# Patient Record
Sex: Male | Born: 1980
Health system: Southern US, Community
[De-identification: ages and names within clinical notes are randomized; demographics above are authoritative.]

## PROBLEM LIST (undated history)

## (undated) DIAGNOSIS — K279 Peptic ulcer, site unspecified, unspecified as acute or chronic, without hemorrhage or perforation: Secondary | ICD-10-CM

## (undated) DIAGNOSIS — B9681 Helicobacter pylori [H. pylori] as the cause of diseases classified elsewhere: Secondary | ICD-10-CM

## (undated) HISTORY — DX: Helicobacter pylori (H. pylori) as the cause of diseases classified elsewhere: B96.81

## (undated) HISTORY — DX: Peptic ulcer, site unspecified, unspecified as acute or chronic, without hemorrhage or perforation: K27.9

---

## 2009-05-17 ENCOUNTER — Emergency Department (HOSPITAL_COMMUNITY): Admission: EM | Admit: 2009-05-17 | Discharge: 2009-05-17 | Payer: Self-pay | Admitting: Family Medicine

## 2009-06-25 ENCOUNTER — Emergency Department (HOSPITAL_COMMUNITY): Admission: EM | Admit: 2009-06-25 | Discharge: 2009-06-25 | Payer: Self-pay | Admitting: Emergency Medicine

## 2012-01-06 ENCOUNTER — Encounter (HOSPITAL_COMMUNITY): Payer: Self-pay | Admitting: *Deleted

## 2012-01-06 ENCOUNTER — Emergency Department (HOSPITAL_COMMUNITY)
Admission: EM | Admit: 2012-01-06 | Discharge: 2012-01-07 | Disposition: A | Discharge status: Home / Self Care | Payer: No Typology Code available for payment source | Attending: Emergency Medicine | Admitting: Emergency Medicine

## 2012-01-06 DIAGNOSIS — M539 Dorsopathy, unspecified: Secondary | ICD-10-CM | POA: Insufficient documentation

## 2012-01-06 DIAGNOSIS — R040 Epistaxis: Secondary | ICD-10-CM | POA: Insufficient documentation

## 2012-01-06 DIAGNOSIS — S139XXA Sprain of joints and ligaments of unspecified parts of neck, initial encounter: Secondary | ICD-10-CM | POA: Insufficient documentation

## 2012-01-06 DIAGNOSIS — Y92410 Unspecified street and highway as the place of occurrence of the external cause: Secondary | ICD-10-CM | POA: Insufficient documentation

## 2012-01-06 DIAGNOSIS — S161XXA Strain of muscle, fascia and tendon at neck level, initial encounter: Secondary | ICD-10-CM

## 2012-01-06 DIAGNOSIS — M542 Cervicalgia: Secondary | ICD-10-CM | POA: Insufficient documentation
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
## 2012-01-07 ENCOUNTER — Emergency Department (HOSPITAL_COMMUNITY): Payer: No Typology Code available for payment source

## 2012-01-07 MED ORDER — CYCLOBENZAPRINE HCL 10 MG PO TABS: 10.0000 mg | ORAL_TABLET | Freq: Two times a day (BID) | ORAL | Status: AC | PRN

## 2012-01-07 MED ORDER — HYDROCODONE-ACETAMINOPHEN 5-325 MG PO TABS: 1.0000 | ORAL_TABLET | ORAL | Status: AC | PRN

## 2012-01-07 NOTE — ED Notes (Signed)
Pt sitting in chair, remains in c-collar.

## 2012-01-07 NOTE — ED Notes (Signed)
No changes, "ready to go".
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
## 2012-01-07 NOTE — Discharge Instructions (Signed)
Cervical Sprain and Strain A cervical sprain is an injury to the neck. The injury can include either over-stretching or even small tears in the ligaments that hold the bones of the neck in place. A strain affects muscles and tendons. Minor injuries usually only involve ligaments and muscles. Because the different parts of the neck are so close together, more severe injuries can involve both sprain and strain. These injuries can affect the muscles, ligaments, tendons, discs, and nerves in the neck. CAUSES  An injury may be the result of a direct blow or from certain habits that can lead to the symptoms noted above.  Injury from:   Contact sports (such as football, rugby, wrestling, hockey, auto racing, gymnastics, diving, martial arts, and boxing).   Motor vehicle accidents.   Whiplash injuries (see image at right). These are common. They occur when the neck is forcefully whipped or forced backward and/or forward.   Falls.   Lifestyle or awkward postures:   Cradling a telephone between the ear and shoulder.   Sitting in a chair that offers no support.   Working at an ill-designed computer station.   Activities that require hours of repeated or long periods of looking up (stretching the neck backward) or looking down (bending the head/neck forward).  SYMPTOMS   Pain, soreness, stiffness, or burning sensation in the front, back, or sides of the neck. This may develop immediately after injury. Onset of discomfort may also develop slowly and not begin for 24 hours or more.   Shoulder and/or upper back pain.   Limits to the normal movement of the neck.   Headache.   Dizziness.   Weakness and/or abnormal sensation (such as numbness or tingling) of one or both arms and/or hands.   Muscle spasm.   Difficulty with swallowing or chewing.   Tenderness and swelling at the injury site.  DIAGNOSIS  Most of the time, your caregiver can diagnose this problem with a careful history and  examination. The history will include information about known problems (such as arthritis in the neck) or a previous neck injury. X-rays may be ordered to find out if there is a different problem. X-rays can also help to find problems with the bones of the neck not related to the injury or current symptoms. TREATMENT  Several treatment options are available to help pain, spasm, and other symptoms. They include:  Cold helps relieve pain and reduce inflammation. Cold should be applied for 10 to 15 minutes every 2 to 3 hours after any activity that aggravates your symptoms. Use ice packs or an ice massage. Place a towel or cloth in between your skin and the ice pack.   Medication:   Only take over-the-counter or prescription medicines for pain, discomfort, or fever as directed by your caregiver.   Pain relievers or muscle relaxants may be prescribed. Use only as directed and only as much as you need.   Change in the activity that caused the problem. This might include using a headset with a telephone so that the phone is not propped between your ear and shoulder.   Neck collar. Your caregiver may recommend temporary use of a soft cervical collar.   Work station. Changes may be needed in your work place. A better sitting position and/or better posture during work may be part of your treatment.   Physical Therapy. Your caregiver may recommend physical therapy. This can include instructions in the use of stretching and strengthening exercises. Improvement in posture is important.   Exercises and posture training can help stabilize the neck and strengthen muscles and keep symptoms from returning.  HOME CARE INSTRUCTIONS  Other than formal physical therapy, all treatments above can be done at home. Even when not at work, it is important to be conscious of your posture and of activities that can cause a return of symptoms. Most cervical sprains and/or strains are better in 1-3 weeks. As you improve and  increase activities, doing a warm up and stretching before the activity will help prevent recurrent problems. SEEK MEDICAL CARE IF:   Pain is not effectively controlled with medication.   You feel unable to decrease pain medication over time as planned.   Activity level is not improving as planned and/or expected.  SEEK IMMEDIATE MEDICAL CARE IF:   While using medication, you develop any bleeding, stomach upset, or signs of an allergic reaction.   Symptoms get worse, become intolerable, and are not helped by medications.   New, unexplained symptoms develop.   You experience numbness, tingling, weakness, or paralysis of any part of your body.  MAKE SURE YOU:   Understand these instructions.   Will watch your condition.   Will get help right away if you are not doing well or get worse.  Document Released: 09/04/2007 Document Revised: 07/20/2011 Document Reviewed: 09/04/2007 [facility] Patient Information 2012 [place], [place].Nosebleed A nosebleed can be caused by many things, including:  Getting hit hard in the nose.   Infections.   Dry nose.   Colds.   Medicines.  Your doctor may do lab testing if you get nosebleeds a lot and the cause is not known. HOME CARE   If your nose was packed with material, keep it there until your doctor takes it out. Put the pack back in your nose if the pack falls out.   Do not blow your nose for 12 hours after the nosebleed.   Sit up and bend forward if your nose starts bleeding again. Pinch the front half of your nose nonstop for 20 minutes.   Put petroleum jelly inside your nose every morning if you have a dry nose.   Use a humidifier to make the air less dry.   Do not take aspirin.   Try not to strain, lift, or bend at the waist for many days after the nosebleed.  GET HELP RIGHT AWAY IF:   Nosebleeds keep happening and are hard to stop or control.   You have bleeding or bruises that are not normal on other parts of the body.    You have a fever.   The nosebleeds get worse.   You get lightheaded, feel faint, sweaty, or throw up (vomit) blood.  MAKE SURE YOU:   Understand these instructions.   Will watch your condition.   Will get help right away if you are not doing well or get worse.  Document Released: 08/16/2008 Document Revised: 07/20/2011 Document Reviewed: 08/16/2008 [facility] Patient Information 2012 [place], [place].

## 2013-02-15 ENCOUNTER — Ambulatory Visit (INDEPENDENT_AMBULATORY_CARE_PROVIDER_SITE_OTHER): Payer: BC Managed Care – PPO | Admitting: Family Medicine

## 2013-02-15 VITALS — BP 110/60 | HR 53 | Temp 98.5°F | Resp 16 | Ht 72.0 in | Wt 190.2 lb

## 2013-02-15 DIAGNOSIS — H6120 Impacted cerumen, unspecified ear: Secondary | ICD-10-CM

## 2013-02-15 DIAGNOSIS — H9202 Otalgia, left ear: Secondary | ICD-10-CM

## 2013-02-15 DIAGNOSIS — H6122 Impacted cerumen, left ear: Secondary | ICD-10-CM

## 2013-02-15 DIAGNOSIS — H9209 Otalgia, unspecified ear: Secondary | ICD-10-CM

## 2013-02-15 NOTE — Patient Instructions (Addendum)
Let us know if you have any other concerns

## 2013-02-15 NOTE — Progress Notes (Signed)
[facility] [address], [place] [address] [phone]  Date:  02/15/2013   Name:  Peter Townsend   DOB:  [birth year]   MRN:  [id number]  PCP:  No primary provider on file.    Chief Complaint: Otalgia   History of Present Illness:  Peter Townsend is a 32 y.o. very pleasant male patient who presents with the following:  He is here with left ear pain for a couple of weeks- it feels clogged.  The right ear is not painful, but also feels clogged.  He does not have a ST or cough, otherwise he feels well.    There is no problem list on file for this patient.   History reviewed. No pertinent past medical history.  History reviewed. No pertinent past surgical history.  History  Substance Use Topics  . Smoking status: Never Smoker   . Smokeless tobacco: Not on file  . Alcohol Use: No    No family history on file.  No Known Allergies  Medication list has been reviewed and updated.  No current outpatient prescriptions on file prior to visit.   No current facility-administered medications on file prior to visit.    Review of Systems:  As per HPI- otherwise negative.   Physical Examination: Filed Vitals:   02/15/13 1543  BP: 110/60  Pulse: 53  Temp: 98.5 F (36.9 C)  Resp: 16   Filed Vitals:   02/15/13 1543  Height: 6' (1.829 m)  Weight: 190 lb 3.2 oz (86.274 kg)   Body mass index is 25.79 kg/(m^2). Ideal Body Weight: Weight in (lb) to have BMI = 25: 183.9  GEN: WDWN, NAD, Non-toxic, A & O x 3 HEENT: Atraumatic, Normocephalic. Neck supple. No masses, No LAD.  Left TM is obscured by cerumen.  Oropharynx wnl.  PEERL Ears and Nose: No external deformity. CV: RRR, No M/G/R. No JVD. No thrill. No extra heart sounds. PULM: CTA B, no wheezes, crackles, rhonchi. No retractions. No resp. distress. No accessory muscle use. EXTR: No c/c/e NEURO Normal gait.  PSYCH: Normally interactive. Conversant. Not depressed or anxious appearing.  Calm  demeanor.   Cerumen impaction resolved with irrigation- TM and ear canal then normal.   Assessment and Plan: Cerumen impaction, left  Left ear pain   Resolved as above with irrigation.  Follow- up if needed Signed Abbe Amsterdam, MD

## 2013-05-02 ENCOUNTER — Ambulatory Visit (INDEPENDENT_AMBULATORY_CARE_PROVIDER_SITE_OTHER): Payer: BC Managed Care – PPO | Admitting: Physician Assistant

## 2013-05-02 VITALS — BP 107/69 | HR 47 | Temp 98.0°F | Resp 16 | Ht 72.5 in | Wt 187.0 lb

## 2013-05-02 DIAGNOSIS — Z23 Encounter for immunization: Secondary | ICD-10-CM

## 2013-05-02 NOTE — Progress Notes (Signed)
  Subjective:    Patient ID: Peter Townsend, male    DOB: 1981-09-26, 32 y.o.   MRN: [id number]  HPI  32 year old male presents for 3rd Tdap. Last 2 were in 2010. 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  He is otherwise healthy with no other concerns today.      Review of Systems  Constitutional: Negative for fever and chills.  Respiratory: Negative for cough.   Skin: Negative for rash.       Objective:   Physical Exam  Constitutional: He is oriented to person, place, and time. He appears well-developed and well-nourished.  HENT:  Head: Normocephalic and atraumatic.  Right Ear: External ear normal.  Left Ear: External ear normal.  Eyes: Conjunctivae are normal.  Neck: Normal range of motion.  Cardiovascular: Normal rate, regular rhythm and normal heart sounds.   Pulmonary/Chest: Effort normal and breath sounds normal.  Neurological: He is alert and oriented to person, place, and time.  Psychiatric: He has a normal mood and affect. His behavior is normal. Judgment and thought content normal.          Assessment & Plan:  Need for Tdap vaccination - Plan: Tdap vaccine greater than or equal to 7yo IM  Tdap #3 given. Form completed and signed.  Follow up as needed.

## 2016-04-09 ENCOUNTER — Ambulatory Visit (INDEPENDENT_AMBULATORY_CARE_PROVIDER_SITE_OTHER): Payer: BLUE CROSS/BLUE SHIELD

## 2016-04-09 ENCOUNTER — Ambulatory Visit (INDEPENDENT_AMBULATORY_CARE_PROVIDER_SITE_OTHER): Payer: BLUE CROSS/BLUE SHIELD | Admitting: Physician Assistant

## 2016-04-09 VITALS — BP 110/72 | HR 58 | Temp 98.1°F | Resp 16 | Ht 72.5 in | Wt 180.2 lb

## 2016-04-09 DIAGNOSIS — R1032 Left lower quadrant pain: Secondary | ICD-10-CM | POA: Diagnosis not present

## 2016-04-09 LAB — POCT CBC
GRANULOCYTE PERCENT: 52.4 % (ref 37–80)
HCT, POC: 42.1 % — AB (ref 43.5–53.7)
HEMOGLOBIN: 14.9 g/dL (ref 14.1–18.1)
LYMPH, POC: 1.8 (ref 0.6–3.4)
MCH, POC: 29.8 pg (ref 27–31.2)
MCHC: 35.3 g/dL (ref 31.8–35.4)
MCV: 84.3 fL (ref 80–97)
MID (cbc): 0.2 (ref 0–0.9)
MPV: 7 fL (ref 0–99.8)
POC GRANULOCYTE: 2.3 (ref 2–6.9)
POC LYMPH PERCENT: 43 %L (ref 10–50)
POC MID %: 4.6 %M (ref 0–12)
Platelet Count, POC: 239 10*3/uL (ref 142–424)
RBC: 5 M/uL (ref 4.69–6.13)
RDW, POC: 12.4 %
WBC: 4.3 10*3/uL — AB (ref 4.6–10.2)

## 2016-04-09 NOTE — Progress Notes (Signed)
04/09/2016 4:23 PM   DOB: 04/09/1981 / MRN: [id number]  SUBJECTIVE:  Peter Townsend is a 35 y.o. male presenting for abdominal pain that has been present for the last 1 months.  Describes the pain as sharp, LLQ, worse in the morning, and better with defecation.  Reports it take him 30 minutes to complete a bowel movement.  He defecates daily.   He has No Known Allergies.   He  has no past medical history on file.    He  reports that he has never smoked. He does not have any smokeless tobacco history on file. He reports that he does not drink alcohol or use illicit drugs. He  has no sexual activity history on file. The patient  has no past surgical history on file.  His family history is not on file.  Review of Systems  Constitutional: Negative for fever and chills.  Cardiovascular: Negative for chest pain.  Gastrointestinal: Positive for constipation. Negative for nausea, blood in stool and melena.  Genitourinary: Negative for dysuria.  Musculoskeletal: Negative for myalgias.  Skin: Negative for rash.  Neurological: Negative for dizziness and headaches.  Psychiatric/Behavioral: Negative for depression.    Problem list and medications reviewed and updated by myself where necessary, and exist elsewhere in the encounter.   OBJECTIVE:  BP 110/72 mmHg  Pulse 58  Temp(Src) 98.1 F (36.7 C) (Oral)  Resp 16  Ht 6' 0.5" (1.842 m)  Wt 180 lb 3.2 oz (81.738 kg)  BMI 24.09 kg/m2  SpO2 99%  Physical Exam  Constitutional: He is oriented to person, place, and time. He appears well-developed. He does not appear ill.  Eyes: Conjunctivae and EOM are normal. Pupils are equal, round, and reactive to light.  Cardiovascular: Normal rate.   Pulmonary/Chest: Effort normal.  Abdominal: He exhibits no distension.  Musculoskeletal: Normal range of motion.  Neurological: He is alert and oriented to person, place, and time. No cranial nerve deficit. Coordination normal.  Skin: Skin is warm and dry.  He is not diaphoretic.  Psychiatric: He has a normal mood and affect.  Nursing note and vitals reviewed.   Results for orders placed or performed in visit on 04/09/16 (from the past 72 hour(s))  POCT CBC     Status: Abnormal   Collection Time: 04/09/16  4:08 PM  Result Value Ref Range   WBC 4.3 (A) 4.6 - 10.2 K/uL   Lymph, poc 1.8 0.6 - 3.4   POC LYMPH PERCENT 43.0 10 - 50 %L   MID (cbc) 0.2 0 - 0.9   POC MID % 4.6 0 - 12 %M   POC Granulocyte 2.3 2 - 6.9   Granulocyte percent 52.4 37 - 80 %G   RBC 5.00 4.69 - 6.13 M/uL   Hemoglobin 14.9 14.1 - 18.1 g/dL   HCT, POC 42.1 (A) 43.5 - 53.7 %   MCV 84.3 80 - 97 fL   MCH, POC 29.8 27 - 31.2 pg   MCHC 35.3 31.8 - 35.4 g/dL   RDW, POC 12.4 %   Platelet Count, POC 239 142 - 424 K/uL   MPV 7.0 0 - 99.8 fL    Dg Abd 2 Views  04/09/2016  CLINICAL DATA:  Left lower quadrant belly pain. EXAM: ABDOMEN - 2 VIEW COMPARISON:  None. FINDINGS: The bowel gas pattern is normal. There is no evidence of free air. No radio-opaque calculi or other significant radiographic abnormality is seen. IMPRESSION: Negative. Electronically Signed   By: Dorise Bullion III  M.D   On: 04/09/2016 16:10    ASSESSMENT AND PLAN  Zhane was seen today for urinary retention.  Diagnoses and all orders for this visit:  LLQ abdominal pain: Most likely constipation.  Rads and labs negative thus far.  Will advise prep via AVS. Advised he get more fiber.  -     POCT CBC -     COMPLETE METABOLIC PANEL WITH GFR -     Lipase -     DG Abd 2 Views; Future   The patient was advised to call or return to clinic if he does not see an improvement in symptoms or to seek the care of the closest emergency department if he worsens with the above plan.   Philis Fendt, MHS, PA-C [facility] 04/09/2016 4:23 PM

## 2016-04-09 NOTE — Patient Instructions (Addendum)
For constipation   Make sure you are drinking enough water daily. Make sure you are getting enough fiber in your diet - this will make you regular - you can eat high fiber foods or use metamucil as a supplement - it is really important to drink enough water when using fiber supplements.  If your stools are hard or are formed balls or you have to strain a stool softener will help - use colace 2-3 capsule a day  For gentle treatment of constipation Use Miralax 1-2 capfuls a day until your stools are soft and regular and then decrease the usage - you can use this daily  For more aggressive treatment of constipation Use 4 capfuls of Colace and 6 doses of Miralax and drink it in 2 hours - this should result in several watery stools - if it does not repeat the next day and then go to daily miralax for a week to make sure your bowels are clean and retrained to work properly  For the most aggressive treatment of constipation Use 14 capfuls of Miralax in 1 gallon of fluid (gatoraid or water work well or a combination of the two) and drink over 12h - it is ok to eat during this time and then use Miralax 1 capful daily for about 2 weeks to prevent the constipation from returning    IF you received an x-ray today, you will receive an invoice from [facility]. Please contact [facility] at [phone] with questions or concerns regarding your invoice.   IF you received labwork today, you will receive an invoice from [employer]. Please contact [facility] at [phone] with questions or concerns regarding your invoice.   Our billing staff will not be able to assist you with questions regarding bills from these companies.  You will be contacted with the lab results as soon as they are available. The fastest way to get your results is to activate your My Chart account. Instructions are located on the last page of this paperwork. If you have not heard from [place]  regarding the results in 2 weeks, please contact this office.

## 2016-04-11 LAB — COMPLETE METABOLIC PANEL WITH GFR
ALT: 18 U/L (ref 9–46)
AST: 16 U/L (ref 10–40)
Albumin: 4.8 g/dL (ref 3.6–5.1)
Alkaline Phosphatase: 60 U/L (ref 40–115)
BILIRUBIN TOTAL: 0.9 mg/dL (ref 0.2–1.2)
BUN: 14 mg/dL (ref 7–25)
CO2: 29 mmol/L (ref 20–31)
CREATININE: 0.87 mg/dL (ref 0.60–1.35)
Calcium: 9.5 mg/dL (ref 8.6–10.3)
Chloride: 102 mmol/L (ref 98–110)
GFR, Est African American: >89 mL/min (ref >=60)
GFR, Est Non African American: >89 mL/min (ref >=60)
GLUCOSE: 97 mg/dL (ref 65–99)
Potassium: 5.2 mmol/L (ref 3.5–5.3)
SODIUM: 139 mmol/L (ref 135–146)
TOTAL PROTEIN: 7.1 g/dL (ref 6.1–8.1)

## 2016-04-11 LAB — LIPASE: Lipase: 25 U/L (ref 7–60)

## 2016-08-16 ENCOUNTER — Telehealth: Payer: Self-pay

## 2016-08-16 ENCOUNTER — Other Ambulatory Visit: Payer: Self-pay | Admitting: Physician Assistant

## 2016-08-16 DIAGNOSIS — R109 Unspecified abdominal pain: Principal | ICD-10-CM

## 2016-08-16 DIAGNOSIS — G8929 Other chronic pain: Secondary | ICD-10-CM

## 2016-08-16 NOTE — Telephone Encounter (Signed)
I'll have to send him to a GI doc.  He is too young for a screening and his insurance may push back.  I will get him in with [facility].  Philis Fendt, MS, PA-C 1:40 PM, 08/16/2016

## 2016-08-16 NOTE — Telephone Encounter (Signed)
Patient has been seen for colon issues and would like a referral for a coloscopy. Please advise!  [phone]

## 2016-08-23 ENCOUNTER — Encounter: Payer: Self-pay | Admitting: *Deleted

## 2016-08-24 ENCOUNTER — Encounter: Payer: Self-pay | Admitting: Internal Medicine

## 2016-08-24 ENCOUNTER — Ambulatory Visit (INDEPENDENT_AMBULATORY_CARE_PROVIDER_SITE_OTHER): Payer: BLUE CROSS/BLUE SHIELD | Admitting: Internal Medicine

## 2016-08-24 VITALS — Ht 71.26 in | Wt 182.1 lb

## 2016-08-24 DIAGNOSIS — R109 Unspecified abdominal pain: Secondary | ICD-10-CM | POA: Diagnosis not present

## 2016-08-24 DIAGNOSIS — Z800 Family history of malignant neoplasm of digestive organs: Secondary | ICD-10-CM

## 2016-08-24 DIAGNOSIS — R194 Change in bowel habit: Secondary | ICD-10-CM

## 2016-08-24 MED ORDER — NA SULFATE-K SULFATE-MG SULF 17.5-3.13-1.6 GM/177ML PO SOLN: ORAL | 0 refills | Status: DC

## 2016-08-24 NOTE — Progress Notes (Signed)
HPI: Peter Townsend is a 35 year old male with little past medical history who seen in consultation at the request of Philis Fendt, PA-C to evaluate left-sided abdominal pain. He reports that over the last 5-6 months he's developed a left-sided abdominal pain associated with the change in bowel habit. He's having bowel movements approximately 2 times per day but his bowel movements have felt incomplete and seen to be taking longer to occur. The pain is better after bowel movement. In the morning he often wakes with left-sided abdominal pain which can be sharp. It again gets better after bowel movement. Initially he felt like the pain may be musculoskeletal due to working out but the pain has persisted. He reports having a history of hemorrhoids that are not bothering him now. He denies blood in his stool or melena. He reports no upper GI complaint including heartburn, dysphagia or odynophagia. No weight loss.  Of note his father was diagnosed with metastatic colon cancer at age 34 in [place]. His brothers have also recently had colonoscopies with colon polyps.  He does not take any medicines and denies prior surgeries.  No past medical history on file.  No past surgical history on file.  No outpatient prescriptions prior to visit.   No facility-administered medications prior to visit.     No Known Allergies  Family History  Problem Relation Age of Onset  . Colon cancer Father 46  . Colon polyps Brother   . Colon polyps Brother     Social History  Substance Use Topics  . Smoking status: Never Smoker  . Smokeless tobacco: Never Used  . Alcohol use No    ROS: As per history of present illness, otherwise negative  BP (P) 114/66   Pulse (P) 64   Ht 5' 11.26" (1.81 m) Comment: w/o shoes  Wt 182 lb 2 oz (82.6 kg)   BMI 25.22 kg/m  Constitutional: Well-developed and well-nourished. No distress. HEENT: Normocephalic and atraumatic. Oropharynx is clear and moist. No oropharyngeal  exudate. Conjunctivae are normal.  No scleral icterus. Neck: Neck supple. Trachea midline. Cardiovascular: Normal rate, regular rhythm and intact distal pulses. No M/R/G Pulmonary/chest: Effort normal and breath sounds normal. No wheezing, rales or rhonchi. Abdominal: Soft, Periumbilical and lower abdominal tenderness without rebound or guarding, nondistended. Bowel sounds active throughout. There are no masses palpable. No hepatosplenomegaly. Extremities: no clubbing, cyanosis, or edema Lymphadenopathy: No cervical adenopathy noted. Neurological: Alert and oriented to person place and time. Skin: Skin is warm and dry. No rashes noted. Psychiatric: Normal mood and affect. Behavior is normal.  RELEVANT LABS AND IMAGING: CBC    Component Value Date/Time   WBC 4.3 (A) 04/09/2016 1608   RBC 5.00 04/09/2016 1608   HGB 14.9 04/09/2016 1608   HCT 42.1 (A) 04/09/2016 1608   MCV 84.3 04/09/2016 1608   MCH 29.8 04/09/2016 1608   MCHC 35.3 04/09/2016 1608    CMP     Component Value Date/Time   NA 139 04/09/2016 1554   K 5.2 04/09/2016 1554   CL 102 04/09/2016 1554   CO2 29 04/09/2016 1554   GLUCOSE 97 04/09/2016 1554   BUN 14 04/09/2016 1554   CREATININE 0.87 04/09/2016 1554   CALCIUM 9.5 04/09/2016 1554   PROT 7.1 04/09/2016 1554   ALBUMIN 4.8 04/09/2016 1554   AST 16 04/09/2016 1554   ALT 18 04/09/2016 1554   ALKPHOS 60 04/09/2016 1554   BILITOT 0.9 04/09/2016 1554   GFRNONAA >89 04/09/2016 1554   GFRAA >89  04/09/2016 1554    ASSESSMENT/PLAN: 35 year old male with little past medical history who seen in consultation at the request of Philis Fendt, PA-C to evaluate left-sided abdominal pain.  1. Left-sided abdominal pain/change in bowel habits/family history of colon cancer and colon polyps -- I recommended that we proceed to colonoscopy to evaluate his symptoms particularly in light of his family history. We discussed the risks, benefits and alternatives and he wishes to  proceed. If colonoscopy is normal we can discuss the addition of fiber or even a mild laxative to help aid in bowel movements, as the colon seems to be the source of his discomfort giving improvement after bowel movement.      [id number] Renaldo Harrison, Pa-c [address] [place], [place] [address]

## 2016-08-24 NOTE — Patient Instructions (Signed)
You have been scheduled for a colonoscopy. Please follow written instructions given to you at your visit today.  Please pick up your prep supplies at the pharmacy within the next 1-3 days. If you use inhalers (even only as needed), please bring them with you on the day of your procedure. Your physician has requested that you go to www.startemmi.com and enter the access code given to you at your visit today. This web site gives a general overview about your procedure. However, you should still follow specific instructions given to you by our office regarding your preparation for the procedure.  If you are age 72 or older, your body mass index should be between 23-30. Your Body mass index is 25.22 kg/m. If this is out of the aforementioned range listed, please consider follow up with your Primary Care Provider.  If you are age 81 or younger, your body mass index should be between 19-25. Your Body mass index is 25.22 kg/m. If this is out of the aformentioned range listed, please consider follow up with your Primary Care Provider.

## 2016-08-29 ENCOUNTER — Telehealth: Payer: Self-pay | Admitting: *Deleted

## 2016-08-29 ENCOUNTER — Telehealth: Payer: Self-pay

## 2016-08-29 ENCOUNTER — Telehealth: Payer: Self-pay | Admitting: Internal Medicine

## 2016-08-29 NOTE — Telephone Encounter (Signed)
Procedure is on thursday

## 2016-08-29 NOTE — Telephone Encounter (Signed)
Called and spoke to the pharmacist today, 10-9.  Gave her the RX BIN, PCN Group and ID numbers on the Suprep savings card.  The patient is to pay no more than $50.00.  The pharmacist was having trouble with her system.  She said she knows it will go though. Historically when we call with these numbers on the ( No more than $50) Suprep card, it goes through for $50.00.  She thanked me for calling them with these numbers.

## 2016-08-29 NOTE — Telephone Encounter (Signed)
Left message for patient that I had a coupon that would make the Suprep $50.  If that will work for him I will call the pharmacy and give them the necessary information

## 2016-08-30 NOTE — Telephone Encounter (Signed)
Pam gave the pharmacy the $50 coupon information for Suprep and will call the patient to let him know.

## 2016-08-30 NOTE — Telephone Encounter (Signed)
$  50 coupon used - Pam will call patient

## 2016-09-01 ENCOUNTER — Ambulatory Visit (AMBULATORY_SURGERY_CENTER): Payer: BLUE CROSS/BLUE SHIELD | Admitting: Internal Medicine

## 2016-09-01 ENCOUNTER — Encounter: Payer: Self-pay | Admitting: Internal Medicine

## 2016-09-01 VITALS — BP 100/55 | HR 49 | Temp 98.4°F | Resp 16 | Ht 71.0 in | Wt 182.0 lb

## 2016-09-01 DIAGNOSIS — Z800 Family history of malignant neoplasm of digestive organs: Secondary | ICD-10-CM

## 2016-09-01 DIAGNOSIS — D3A026 Benign carcinoid tumor of the rectum: Secondary | ICD-10-CM | POA: Diagnosis not present

## 2016-09-01 DIAGNOSIS — D128 Benign neoplasm of rectum: Secondary | ICD-10-CM

## 2016-09-01 DIAGNOSIS — R109 Unspecified abdominal pain: Secondary | ICD-10-CM

## 2016-09-01 DIAGNOSIS — Z8371 Family history of colonic polyps: Secondary | ICD-10-CM

## 2016-09-01 HISTORY — PX: COLONOSCOPY: SHX174

## 2016-09-01 MED ORDER — SODIUM CHLORIDE 0.9 % IV SOLN
500.0000 mL | INTRAVENOUS | Status: DC
Start: 2016-09-01 — End: 2017-01-16

## 2016-09-01 NOTE — Op Note (Addendum)
[facility] Patient Name: Peter Townsend Procedure Date: 09/01/2016 2:56 PM MRN: [id number] Endoscopist: Jerene Bears , MD Age: 35 Referring MD:  Date of Birth: 10-Nov-1981 Gender: Male Account #: [id number] Procedure:                Colonoscopy Indications:              Abdominal pain in the left lower quadrant, Family                            history of colon cancer in a first-degree relative,                            Family history of colonic polyps in a first-degree                            relative, Change in bowel habits Medicines:                Monitored Anesthesia Care Procedure:                Pre-Anesthesia Assessment:                           - Prior to the procedure, a History and Physical                            was performed, and patient medications and                            allergies were reviewed. The patient's tolerance of                            previous anesthesia was also reviewed. The risks                            and benefits of the procedure and the sedation                            options and risks were discussed with the patient.                            All questions were answered, and informed consent                            was obtained. Prior Anticoagulants: The patient has                            taken no previous anticoagulant or antiplatelet                            agents. ASA Grade Assessment: I - A normal, healthy                            patient. After reviewing the risks and benefits,  the patient was deemed in satisfactory condition to                            undergo the procedure.                           After obtaining informed consent, the colonoscope                            was passed under direct vision. Throughout the                            procedure, the patient's blood pressure, pulse, and                            oxygen saturations were monitored  continuously. The                            [id number]) was introduced through the anus                            and advanced to the the terminal ileum. The                            colonoscopy was performed without difficulty. The                            patient tolerated the procedure well. The quality                            of the bowel preparation was good. The terminal                            ileum, ileocecal valve, appendiceal orifice, and                            rectum were photographed. Scope In: 3:04:21 PM Scope Out: 3:15:57 PM Scope Withdrawal Time: 0 hours 9 minutes 23 seconds  Total Procedure Duration: 0 hours 11 minutes 36 seconds  Findings:                 The digital rectal exam was normal.                           The terminal ileum appeared normal.                           A 4 mm polyp was found in the rectum. The polyp was                            sessile. The polyp was removed with a cold snare.                            Resection and retrieval were complete.  The exam was otherwise without abnormality on                            direct and retroflexion views. Complications:            No immediate complications. Estimated Blood Loss:     Estimated blood loss was minimal. Impression:               - The examined portion of the ileum was normal.                           - One 4 mm polyp in the rectum, removed with a cold                            snare. Resected and retrieved.                           - The examination was otherwise normal on direct                            and retroflexion views. Recommendation:           - Patient has a contact number available for                            emergencies. The signs and symptoms of potential                            delayed complications were discussed with the                            patient. Return to normal activities tomorrow.                             Written discharge instructions were provided to the                            patient.                           - Resume previous diet.                           - Continue present medications.                           - Await pathology results.                           - Repeat colonoscopy in 5 years. Jerene Bears, MD 09/01/2016 3:20:03 PM This report has been signed electronically.

## 2016-09-01 NOTE — Progress Notes (Signed)
Called to room to assist during endoscopic procedure.  Patient ID and intended procedure confirmed with present staff. Received instructions for my participation in the procedure from the performing physician.  

## 2016-09-01 NOTE — Patient Instructions (Signed)
YOU HAD AN ENDOSCOPIC PROCEDURE TODAY AT THE [facility]:   Refer to the procedure report that was given to you for any specific questions about what was found during the examination.  If the procedure report does not answer your questions, please call your gastroenterologist to clarify.  If you requested that your care partner not be given the details of your procedure findings, then the procedure report has been included in a sealed envelope for you to review at your convenience later.  YOU SHOULD EXPECT: Some feelings of bloating in the abdomen. Passage of more gas than usual.  Walking can help get rid of the air that was put into your GI tract during the procedure and reduce the bloating. If you had a lower endoscopy (such as a colonoscopy or flexible sigmoidoscopy) you may notice spotting of blood in your stool or on the toilet paper. If you underwent a bowel prep for your procedure, you may not have a normal bowel movement for a few days.  Please Note:  You might notice some irritation and congestion in your nose or some drainage.  This is from the oxygen used during your procedure.  There is no need for concern and it should clear up in a day or so.  SYMPTOMS TO REPORT IMMEDIATELY:   Following lower endoscopy (colonoscopy or flexible sigmoidoscopy):  Excessive amounts of blood in the stool  Significant tenderness or worsening of abdominal pains  Swelling of the abdomen that is new, acute  Fever of 100F or higher   For urgent or emergent issues, a gastroenterologist can be reached at any hour by calling [phone].   DIET:  We do recommend a small meal at first, but then you may proceed to your regular diet.  Drink plenty of fluids but you should avoid alcoholic beverages for 24 hours.  ACTIVITY:  You should plan to take it easy for the rest of today and you should NOT DRIVE or use heavy machinery until tomorrow (because of the sedation medicines used during the test).     FOLLOW UP: Our staff will call the number listed on your records the next business day following your procedure to check on you and address any questions or concerns that you may have regarding the information given to you following your procedure. If we do not reach you, we will leave a message.  However, if you are feeling well and you are not experiencing any problems, there is no need to return our call.  We will assume that you have returned to your regular daily activities without incident.  If any biopsies were taken you will be contacted by phone or by letter within the next 1-3 weeks.  Please call us at [phone] if you have not heard about the biopsies in 3 weeks.    SIGNATURES/CONFIDENTIALITY: You and/or your care partner have signed paperwork which will be entered into your electronic medical record.  These signatures attest to the fact that that the information above on your After Visit Summary has been reviewed and is understood.  Full responsibility of the confidentiality of this discharge information lies with you and/or your care-partner.  Read all of the handouts given to you by your recovery room nurse. 

## 2016-09-01 NOTE — Progress Notes (Signed)
Report to PACU, RN, vss, BBS= Clear.  

## 2016-09-02 ENCOUNTER — Telehealth: Payer: Self-pay | Admitting: *Deleted

## 2016-09-02 ENCOUNTER — Telehealth: Payer: Self-pay

## 2016-09-02 NOTE — Telephone Encounter (Signed)
  Follow up Call-  Call back number 09/01/2016  Post procedure Call Back phone  # [phone]  Permission to leave phone message Yes  Some recent data might be hidden    Patient was called for follow up after his procedure on 09/01/2016. No answer at the number given for follow up phone call. A message was left on the answering machine. This was the second attempt to contact the patient.

## 2016-09-02 NOTE — Telephone Encounter (Signed)
  Follow up Call-  Call back number 09/01/2016  Post procedure Call Back phone  # [phone]  Permission to leave phone message Yes  Some recent data might be hidden     Patient questions:  Message left to call us if necessary.

## 2016-09-12 ENCOUNTER — Telehealth: Payer: Self-pay | Admitting: Internal Medicine

## 2016-09-12 NOTE — Telephone Encounter (Signed)
Patient contacted on home number 2. No answer either time. Second call left message for him without specifics. I did state that I had pathology results to discuss with him. I left my office number for him to call back, otherwise I will try him again tomorrow

## 2016-09-13 ENCOUNTER — Telehealth: Payer: Self-pay | Admitting: Internal Medicine

## 2016-09-13 NOTE — Telephone Encounter (Signed)
Spoke to patient by phone regarding low-grade carcinoid removed from rectum Also discuss case with colleague, Dr. Ardis Hughs My recommendation is to perform flexible sigmoidoscopy this Thursday at 2:30 PM with plans to take wider margin to ensure complete resection; assuming I'm able to locate previous/recent polypectomy Full colonoscopy recommended again in 3 years Patient will wait to hear from my staff regarding prep instructions and appointment details Phone discussion in lieu of pathology letter

## 2016-09-13 NOTE — Telephone Encounter (Signed)
Patient states that he is returning Dr. Vena Rua call from last night regarding results. Best # [phone]

## 2016-09-13 NOTE — Telephone Encounter (Signed)
Patient is calling back again to speak with Dr.Pyrtle. [phone].

## 2016-09-13 NOTE — Telephone Encounter (Signed)
Patient has been advised that he is scheduled for sigmoidoscopy on Thursday at 2:30 pm with 1:30 pm arrival. He is scheduled for previsit tomorrow at 4 pm for instructions and to sign paperwork. He verbalizes understanding. I have also placed a recall colonosocpy in EPIC for 08/2019.

## 2016-09-14 ENCOUNTER — Ambulatory Visit: Payer: BLUE CROSS/BLUE SHIELD | Admitting: *Deleted

## 2016-09-14 VITALS — Ht 71.0 in | Wt 178.2 lb

## 2016-09-14 DIAGNOSIS — C7A00 Malignant carcinoid tumor of unspecified site: Secondary | ICD-10-CM

## 2016-09-14 NOTE — Progress Notes (Signed)
Patient denies any allergies to egg or soy products. Patient denies complications with anesthesia/sedation.  Patient denies oxygen use at home and denies diet medications. Pamphlet given - flexible sigmoidoscopy.

## 2016-09-15 ENCOUNTER — Encounter: Payer: Self-pay | Admitting: Internal Medicine

## 2016-09-15 ENCOUNTER — Ambulatory Visit (AMBULATORY_SURGERY_CENTER): Payer: BLUE CROSS/BLUE SHIELD | Admitting: Internal Medicine

## 2016-09-15 VITALS — BP 99/56 | HR 51 | Temp 98.4°F | Resp 12 | Ht 71.0 in | Wt 178.0 lb

## 2016-09-15 DIAGNOSIS — Z85040 Personal history of malignant carcinoid tumor of rectum: Secondary | ICD-10-CM

## 2016-09-15 DIAGNOSIS — D3A026 Benign carcinoid tumor of the rectum: Secondary | ICD-10-CM

## 2016-09-15 DIAGNOSIS — D175 Benign lipomatous neoplasm of intra-abdominal organs: Secondary | ICD-10-CM | POA: Diagnosis not present

## 2016-09-15 MED ORDER — SODIUM CHLORIDE 0.9 % IV SOLN: 500.0000 mL | INTRAVENOUS | Status: DC

## 2016-09-15 NOTE — Progress Notes (Signed)
A and O x3. Report to RN. Tolerated MAC anesthesia well. 

## 2016-09-15 NOTE — Patient Instructions (Signed)

## 2016-09-15 NOTE — Progress Notes (Signed)
Called to room to assist during endoscopic procedure.  Patient ID and intended procedure confirmed with present staff. Received instructions for my participation in the procedure from the performing physician.  

## 2016-09-15 NOTE — Op Note (Signed)
[facility] Patient Name: Peter Townsend Procedure Date: 09/15/2016 2:28 PM MRN: [id number] Endoscopist: Jerene Bears , MD Age: 35 Referring MD:  Date of Birth: [birth year] Gender: Male Account #: [id number] Procedure:                Flexible Sigmoidoscopy Indications:              Personal history of rectal carcinoid (cold                            polypectomy 09/01/2016) for wider resection to                            ensure complete removal Medicines:                Monitored Anesthesia Care Procedure:                Pre-Anesthesia Assessment:                           - Prior to the procedure, a History and Physical                            was performed, and patient medications and                            allergies were reviewed. The patient's tolerance of                            previous anesthesia was also reviewed. The risks                            and benefits of the procedure and the sedation                            options and risks were discussed with the patient.                            All questions were answered, and informed consent                            was obtained. Prior Anticoagulants: The patient has                            taken no previous anticoagulant or antiplatelet                            agents. ASA Grade Assessment: II - A patient with                            mild systemic disease. After reviewing the risks                            and benefits, the patient was deemed in  satisfactory condition to undergo the procedure.                           After obtaining informed consent, the scope was                            passed under direct vision. The Model PCF-H190L                            [id number]) scope was introduced through the anus                            and advanced to the the sigmoid colon. The flexible                            sigmoidoscopy was accomplished without  difficulty.                            The patient tolerated the procedure well. The                            quality of the bowel preparation was good. Scope In: Scope Out: Findings:                 The digital rectal exam was normal.                           A 5 mm post polypectomy scar was found in the                            rectum. There was no evidence of the previous                            polyp. A wider margin including the                            post-polypectomy scar was removed with a hot snare.                            Resection and retrieval were complete. Area                            immediately proximal and distal to the polypectomy                            site was tattooed with an injection of Spot (carbon                            black), 0.5 mL each.                           The exam was otherwise without abnormality. Complications:            No immediate complications. Estimated Blood Loss:     Estimated blood  loss: none. Impression:               - Post-polypectomy scar in the rectum. Wider margin                            resected hot snare. Tattooed.                           - The examination was otherwise normal. Recommendation:           - Patient has a contact number available for                            emergencies. The signs and symptoms of potential                            delayed complications were discussed with the                            patient. Return to normal activities tomorrow.                            Written discharge instructions were provided to the                            patient.                           - Resume previous diet.                           - Continue present medications.                           - Await pathology results which will guide future                            treatment/surveillance decisions.                           - No aspirin, ibuprofen, naproxen, or other                             non-steroidal anti-inflammatory drugs for 3 weeks                            after polyp removal. Jerene Bears, MD 09/15/2016 2:52:38 PM This report has been signed electronically.

## 2016-09-16 ENCOUNTER — Telehealth: Payer: Self-pay

## 2016-09-16 NOTE — Telephone Encounter (Signed)
  Follow up Call-  Call back number 09/15/2016 09/01/2016  Post procedure Call Back phone  # [phone]  Permission to leave phone message Yes Yes  Some recent data might be hidden     Patient questions:  Do you have a fever, pain , or abdominal swelling? No. Pain Score  0 *  Have you tolerated food without any problems? Yes.    Have you been able to return to your normal activities? Yes.    Do you have any questions about your discharge instructions: Diet   No. Medications  No. Follow up visit  No.  Do you have questions or concerns about your Care? No.  Actions: * If pain score is 4 or above: No action needed, pain <4.

## 2016-09-19 ENCOUNTER — Telehealth: Payer: Self-pay

## 2016-09-19 ENCOUNTER — Other Ambulatory Visit: Payer: Self-pay

## 2016-09-19 DIAGNOSIS — D3A026 Benign carcinoid tumor of the rectum: Secondary | ICD-10-CM

## 2016-09-19 NOTE — Telephone Encounter (Signed)
Pt scheduled for Previsit 09/22/16@10 :30am, EGD scheduled in the [facility] 09/27/16@9am . Pt scheduled for CT of CAP at [facility] CT 09/23/16@3pm . Pt to be NPO after 11am, drink bottle 1 of contrast at 1pm and bottle 2 at 2pm. Left message for pt to call back regarding appt.

## 2016-09-19 NOTE — Telephone Encounter (Signed)
Pt aware of appts and detailed instructions sent to pt via mychart.

## 2016-09-19 NOTE — Telephone Encounter (Signed)
-----  Message from Jay M Pyrtle, MD sent at 09/15/2016  5:11 PM EDT ----- Regarding: Further testing I met with the patient, his wife and a friend who is a primary physician after his procedure He is very anxious about other carcinoids throughout the gut. He has a strong family history of colon cancer. He is requesting further testing to be cautious I let him know that my suspicion of carcinoid and other places within and outside the GI tract is low.  He has asked for cross-sectional imaging and upper endoscopy  After this discussion we will proceed.  Please proceed with CT chest abdomen pelvis with IV contrast and schedule upper endoscopy  Indication for both his history of rectal carcinoid  JMP 

## 2016-09-21 ENCOUNTER — Telehealth: Payer: Self-pay | Admitting: Internal Medicine

## 2016-09-21 NOTE — Telephone Encounter (Signed)
Spoke with pt and reviewed results with pt, path report sent via [employer].

## 2016-09-22 ENCOUNTER — Ambulatory Visit (AMBULATORY_SURGERY_CENTER): Payer: Self-pay | Admitting: *Deleted

## 2016-09-22 VITALS — Ht 71.5 in | Wt 179.8 lb

## 2016-09-22 DIAGNOSIS — R1032 Left lower quadrant pain: Secondary | ICD-10-CM

## 2016-09-22 NOTE — Progress Notes (Signed)
No egg or soy allergy  No anesthesia or intubation problems per pt  No diet medications taken   

## 2016-09-23 ENCOUNTER — Ambulatory Visit (INDEPENDENT_AMBULATORY_CARE_PROVIDER_SITE_OTHER)
Admission: RE | Admit: 2016-09-23 | Discharge: 2016-09-23 | Disposition: A | Discharge status: Home / Self Care | Payer: BLUE CROSS/BLUE SHIELD | Source: Ambulatory Visit | Attending: Internal Medicine | Admitting: Internal Medicine

## 2016-09-23 ENCOUNTER — Inpatient Hospital Stay: Admission: RE | Admit: 2016-09-23 | Payer: BLUE CROSS/BLUE SHIELD | Source: Ambulatory Visit

## 2016-09-23 ENCOUNTER — Telehealth: Payer: Self-pay | Admitting: Internal Medicine

## 2016-09-23 DIAGNOSIS — D3A026 Benign carcinoid tumor of the rectum: Secondary | ICD-10-CM

## 2016-09-23 MED ORDER — IOPAMIDOL (ISOVUE-300) INJECTION 61%
100.0000 mL | Freq: Once | INTRAVENOUS | Status: AC | PRN
  Administered 2016-09-23: 100 mL via INTRAVENOUS

## 2016-09-26 NOTE — Telephone Encounter (Signed)
See CT results for additional details 

## 2016-09-26 NOTE — Telephone Encounter (Signed)
Dr. Hilarie Fredrickson please review and advise

## 2016-09-26 NOTE — Telephone Encounter (Signed)
See result note on CT chest which includes abd

## 2016-09-27 ENCOUNTER — Encounter: Payer: Self-pay | Admitting: Internal Medicine

## 2016-09-27 ENCOUNTER — Ambulatory Visit (AMBULATORY_SURGERY_CENTER): Payer: BLUE CROSS/BLUE SHIELD | Admitting: Internal Medicine

## 2016-09-27 VITALS — BP 101/60 | HR 50 | Temp 97.5°F | Resp 12 | Ht 71.0 in | Wt 182.0 lb

## 2016-09-27 DIAGNOSIS — R1032 Left lower quadrant pain: Secondary | ICD-10-CM

## 2016-09-27 DIAGNOSIS — Z86012 Personal history of benign carcinoid tumor: Secondary | ICD-10-CM | POA: Diagnosis not present

## 2016-09-27 DIAGNOSIS — B9681 Helicobacter pylori [H. pylori] as the cause of diseases classified elsewhere: Secondary | ICD-10-CM | POA: Diagnosis not present

## 2016-09-27 DIAGNOSIS — K2970 Gastritis, unspecified, without bleeding: Secondary | ICD-10-CM | POA: Diagnosis not present

## 2016-09-27 MED ORDER — SODIUM CHLORIDE 0.9 % IV SOLN: 500.0000 mL | INTRAVENOUS | Status: DC

## 2016-09-27 NOTE — Op Note (Signed)
[name] Procedure Date: 09/27/2016 8:54 AM MRN: [id number] Endoscopist: Jerene Bears , MD Age: 35 Referring MD:  Date of Birth: 22-Jul-1981 Gender: Male Account #: [id number] Procedure:                Upper GI endoscopy Indications:              Left sided abdominal pain, history of rectal                            carcinoid (benign) Medicines:                Monitored Anesthesia Care Procedure:                Pre-Anesthesia Assessment:                           - Prior to the procedure, a History and Physical                            was performed, and patient medications and                            allergies were reviewed. The patient's tolerance of                            previous anesthesia was also reviewed. The risks                            and benefits of the procedure and the sedation                            options and risks were discussed with the patient.                            All questions were answered, and informed consent                            was obtained. Prior Anticoagulants: The patient has                            taken no previous anticoagulant or antiplatelet                            agents. ASA Grade Assessment: II - A patient with                            mild systemic disease. After reviewing the risks                            and benefits, the patient was deemed in                            satisfactory condition to undergo the procedure.  After obtaining informed consent, the endoscope was                            passed under direct vision. Throughout the                            procedure, the patient's blood pressure, pulse, and                            oxygen saturations were monitored continuously. The                            Model GIF-HQ190 [id number]) scope was introduced                            through the mouth, and advanced to the second  part                            of duodenum. The upper GI endoscopy was                            accomplished without difficulty. The patient                            tolerated the procedure well. Scope In: Scope Out: Findings:                 LA Grade A (one or more mucosal breaks less than 5                            mm, not extending between tops of 2 mucosal folds)                            esophagitis was found at the gastroesophageal                            junction.                           The cardia and gastric fundus were normal on                            retroflexion.                           The entire examined stomach was normal. Biopsies                            were taken with a cold forceps for histology and to                            exclude H. Pylori.                           The examined duodenum was normal.  No evidence of polyps or submucosal lesions in the                            examined upper GI tract. Complications:            No immediate complications. Estimated Blood Loss:     Estimated blood loss was minimal. Impression:               - LA Grade A reflux esophagitis.                           - Normal stomach. Biopsied.                           - Normal examined duodenum.                           - No polyps or submucosal lesions seen in the                            examined upper GI tract. Recommendation:           - Patient has a contact number available for                            emergencies. The signs and symptoms of potential                            delayed complications were discussed with the                            patient. Return to normal activities tomorrow.                            Written discharge instructions were provided to the                            patient.                           - Resume previous diet.                           - Continue present medications.                            - Ranitidine 150 mg can be used twice daily as                            needed for reflux/esophageal symptoms.                           - Await pathology results. Jerene Bears, MD 09/27/2016 9:12:51 AM This report has been signed electronically.

## 2016-09-27 NOTE — Progress Notes (Signed)
To PACU, vss patent aw report to rn 

## 2016-09-27 NOTE — Patient Instructions (Signed)
YOU HAD AN ENDOSCOPIC PROCEDURE TODAY AT [facility]:   Refer to the procedure report that was given to you for any specific questions about what was found during the examination.  If the procedure report does not answer your questions, please call your gastroenterologist to clarify.  If you requested that your care partner not be given the details of your procedure findings, then the procedure report has been included in a sealed envelope for you to review at your convenience later.  YOU SHOULD EXPECT: Some feelings of bloating in the abdomen. Passage of more gas than usual.  Walking can help get rid of the air that was put into your GI tract during the procedure and reduce the bloating. If you had a lower endoscopy (such as a colonoscopy or flexible sigmoidoscopy) you may notice spotting of blood in your stool or on the toilet paper. If you underwent a bowel prep for your procedure, you may not have a normal bowel movement for a few days.  Please Note:  You might notice some irritation and congestion in your nose or some drainage.  This is from the oxygen used during your procedure.  There is no need for concern and it should clear up in a day or so.  SYMPTOMS TO REPORT IMMEDIATELY:     Following upper endoscopy (EGD)  Vomiting of blood or coffee ground material  New chest pain or pain under the shoulder blades  Painful or persistently difficult swallowing  New shortness of breath  Fever of 100F or higher  Black, tarry-looking stools  For urgent or emergent issues, a gastroenterologist can be reached at any hour by calling [phone].   DIET:  We do recommend a small meal at first, but then you may proceed to your regular diet.  Drink plenty of fluids but you should avoid alcoholic beverages for 24 hours.  ACTIVITY:  You should plan to take it easy for the rest of today and you should NOT DRIVE or use heavy machinery until tomorrow (because of the sedation medicines  used during the test).    FOLLOW UP: Our staff will call the number listed on your records the next business day following your procedure to check on you and address any questions or concerns that you may have regarding the information given to you following your procedure. If we do not reach you, we will leave a message.  However, if you are feeling well and you are not experiencing any problems, there is no need to return our call.  We will assume that you have returned to your regular daily activities without incident.  If any biopsies were taken you will be contacted by phone or by letter within the next 1-3 weeks.  Please call us at [phone] if you have not heard about the biopsies in 3 weeks.    SIGNATURES/CONFIDENTIALITY: You and/or your care partner have signed paperwork which will be entered into your electronic medical record.  These signatures attest to the fact that that the information above on your After Visit Summary has been reviewed and is understood.  Full responsibility of the confidentiality of this discharge information lies with you and/or your care-partner   Ranitidine 150 mg twice daily as needed for reflux symptoms  Await pathology results.

## 2016-09-27 NOTE — Progress Notes (Signed)
Called to room to assist during endoscopic procedure.  Patient ID and intended procedure confirmed with present staff. Received instructions for my participation in the procedure from the performing physician.  

## 2016-09-28 ENCOUNTER — Telehealth: Payer: Self-pay

## 2016-09-28 NOTE — Telephone Encounter (Signed)
  Follow up Call-  Call back number 09/27/2016 09/15/2016 09/01/2016  Post procedure Call Back phone  # [phone]  Permission to leave phone message Yes Yes Yes  Some recent data might be hidden     Patient questions:  Do you have a fever, pain , or abdominal swelling? No. Pain Score  0 *  Have you tolerated food without any problems? Yes.    Have you been able to return to your normal activities? Yes.    Do you have any questions about your discharge instructions: Diet   No. Medications  No. Follow up visit  No.  Do you have questions or concerns about your Care? No.  Actions: * If pain score is 4 or above: No action needed, pain <4.

## 2016-10-03 ENCOUNTER — Other Ambulatory Visit: Payer: Self-pay

## 2016-10-03 DIAGNOSIS — A048 Other specified bacterial intestinal infections: Secondary | ICD-10-CM

## 2016-10-03 MED ORDER — OMEPRAZOLE 20 MG PO CPDR: 20.0000 mg | DELAYED_RELEASE_CAPSULE | Freq: Two times a day (BID) | ORAL | 0 refills | Status: DC

## 2016-10-03 MED ORDER — BIS SUBCIT-METRONID-TETRACYC 140-125-125 MG PO CAPS: 3.0000 | ORAL_CAPSULE | Freq: Three times a day (TID) | ORAL | 0 refills | Status: DC

## 2016-10-04 ENCOUNTER — Telehealth: Payer: Self-pay | Admitting: *Deleted

## 2016-10-04 ENCOUNTER — Telehealth: Payer: Self-pay | Admitting: Internal Medicine

## 2016-10-04 ENCOUNTER — Ambulatory Visit (INDEPENDENT_AMBULATORY_CARE_PROVIDER_SITE_OTHER): Payer: BLUE CROSS/BLUE SHIELD | Admitting: Family Medicine

## 2016-10-04 VITALS — BP 114/80 | HR 56 | Temp 98.3°F | Resp 16 | Ht 71.0 in | Wt 180.6 lb

## 2016-10-04 DIAGNOSIS — R42 Dizziness and giddiness: Secondary | ICD-10-CM

## 2016-10-04 DIAGNOSIS — R001 Bradycardia, unspecified: Secondary | ICD-10-CM

## 2016-10-04 DIAGNOSIS — J069 Acute upper respiratory infection, unspecified: Secondary | ICD-10-CM | POA: Diagnosis not present

## 2016-10-04 LAB — POCT CBC
GRANULOCYTE PERCENT: 64.8 % (ref 37–80)
HCT, POC: 42.5 % — AB (ref 43.5–53.7)
Hemoglobin: 15 g/dL (ref 14.1–18.1)
Lymph, poc: 1.5 (ref 0.6–3.4)
MCH, POC: 29.9 pg (ref 27–31.2)
MCHC: 35.2 g/dL (ref 31.8–35.4)
MCV: 85 fL (ref 80–97)
MID (cbc): 0.3 (ref 0–0.9)
MPV: 6.9 fL (ref 0–99.8)
PLATELET COUNT, POC: 232 10*3/uL (ref 142–424)
POC Granulocyte: 3.3 (ref 2–6.9)
POC LYMPH %: 28.8 % (ref 10–50)
POC MID %: 6.4 %M (ref 0–12)
RBC: 5 M/uL (ref 4.69–6.13)
RDW, POC: 12.5 %
WBC: 5.1 10*3/uL (ref 4.6–10.2)

## 2016-10-04 LAB — GLUCOSE, POCT (MANUAL RESULT ENTRY): POC Glucose: 95 mg/dl (ref 70–99)

## 2016-10-04 NOTE — Patient Instructions (Addendum)
Your blood count, blood sugar, and EKG are without any acute findings today. See information below on dizziness, but I suspect some this may be due to sinus congestion. Salt water nasal spray 4-5 times per day, Mucinex as needed for cough. Drink plenty fluids and rest. Return to the clinic or go to the nearest emergency room if any of your symptoms worsen or new symptoms occur.   Dizziness Dizziness is a common problem. It is a feeling of unsteadiness or light-headedness. You may feel like you are about to faint. Dizziness can lead to injury if you stumble or fall. Anyone can become dizzy, but dizziness is more common in older adults. This condition can be caused by a number of things, including medicines, dehydration, or illness. Follow these instructions at home: Taking these steps may help with your condition: Eating and drinking  Drink enough fluid to keep your urine clear or pale yellow. This helps to keep you from becoming dehydrated. Try to drink more clear fluids, such as water.  Do not drink alcohol.  Limit your caffeine intake if directed by your health care provider.  Limit your salt intake if directed by your health care provider. Activity  Avoid making quick movements.  Rise slowly from chairs and steady yourself until you feel okay.  In the morning, first sit up on the side of the bed. When you feel okay, stand slowly while you hold onto something until you know that your balance is fine.  Move your legs often if you need to stand in one place for a long time. Tighten and relax your muscles in your legs while you are standing.  Do not drive or operate heavy machinery if you feel dizzy.  Avoid bending down if you feel dizzy. Place items in your home so that they are easy for you to reach without leaning over. Lifestyle  Do not use any tobacco products, including cigarettes, chewing tobacco, or electronic cigarettes. If you need help quitting, ask your health care  provider.  Try to reduce your stress level, such as with yoga or meditation. Talk with your health care provider if you need help. General instructions  Watch your dizziness for any changes.  Take medicines only as directed by your health care provider. Talk with your health care provider if you think that your dizziness is caused by a medicine that you are taking.  Tell a friend or a family member that you are feeling dizzy. If he or she notices any changes in your behavior, have this person call your health care provider.  Keep all follow-up visits as directed by your health care provider. This is important. Contact a health care provider if:  Your dizziness does not go away.  Your dizziness or light-headedness gets worse.  You feel nauseous.  You have reduced hearing.  You have new symptoms.  You are unsteady on your feet or you feel like the room is spinning. Get help right away if:  You vomit or have diarrhea and are unable to eat or drink anything.  You have problems talking, walking, swallowing, or using your arms, hands, or legs.  You feel generally weak.  You are not thinking clearly or you have trouble forming sentences. It may take a friend or family member to notice this.  You have chest pain, abdominal pain, shortness of breath, or sweating.  Your vision changes.  You notice any bleeding.  You have a headache.  You have neck pain or a stiff  neck.  You have a fever. This information is not intended to replace advice given to you by your health care provider. Make sure you discuss any questions you have with your health care provider. Document Released: 05/03/2001 Document Revised: 04/14/2016 Document Reviewed: 11/03/2014 Elsevier Interactive Patient Education  2017 [place]    Upper Respiratory Infection, Adult Most upper respiratory infections (URIs) are a viral infection of the air passages leading to the lungs. A URI affects the nose, throat,  and upper air passages. The most common type of URI is nasopharyngitis and is typically referred to as "the common cold." URIs run their course and usually go away on their own. Most of the time, a URI does not require medical attention, but sometimes a bacterial infection in the upper airways can follow a viral infection. This is called a secondary infection. Sinus and middle ear infections are common types of secondary upper respiratory infections. Bacterial pneumonia can also complicate a URI. A URI can worsen asthma and chronic obstructive pulmonary disease (COPD). Sometimes, these complications can require emergency medical care and may be life threatening. What are the causes? Almost all URIs are caused by viruses. A virus is a type of germ and can spread from one person to another. What increases the risk? You may be at risk for a URI if:  You smoke.  You have chronic heart or lung disease.  You have a weakened defense (immune) system.  You are very young or very old.  You have nasal allergies or asthma.  You work in crowded or poorly ventilated areas.  You work in health care facilities or schools. What are the signs or symptoms? Symptoms typically develop 2-3 days after you come in contact with a cold virus. Most viral URIs last 7-10 days. However, viral URIs from the influenza virus (flu virus) can last 14-18 days and are typically more severe. Symptoms may include:  Runny or stuffy (congested) nose.  Sneezing.  Cough.  Sore throat.  Headache.  Fatigue.  Fever.  Loss of appetite.  Pain in your forehead, behind your eyes, and over your cheekbones (sinus pain).  Muscle aches. How is this diagnosed? Your health care provider may diagnose a URI by:  Physical exam.  Tests to check that your symptoms are not due to another condition such as:  Strep throat.  Sinusitis.  Pneumonia.  Asthma. How is this treated? A URI goes away on its own with time. It  cannot be cured with medicines, but medicines may be prescribed or recommended to relieve symptoms. Medicines may help:  Reduce your fever.  Reduce your cough.  Relieve nasal congestion. Follow these instructions at home:  Take medicines only as directed by your health care provider.  Gargle warm saltwater or take cough drops to comfort your throat as directed by your health care provider.  Use a warm mist humidifier or inhale steam from a shower to increase air moisture. This may make it easier to breathe.  Drink enough fluid to keep your urine clear or pale yellow.  Eat soups and other clear broths and maintain good nutrition.  Rest as needed.  Return to work when your temperature has returned to normal or as your health care provider advises. You may need to stay home longer to avoid infecting others. You can also use a face mask and careful hand washing to prevent spread of the virus.  Increase the usage of your inhaler if you have asthma.  Do not use any  tobacco products, including cigarettes, chewing tobacco, or electronic cigarettes. If you need help quitting, ask your health care provider. How is this prevented? The best way to protect yourself from getting a cold is to practice good hygiene.  Avoid oral or hand contact with people with cold symptoms.  Wash your hands often if contact occurs. There is no clear evidence that vitamin C, vitamin E, echinacea, or exercise reduces the chance of developing a cold. However, it is always recommended to get plenty of rest, exercise, and practice good nutrition. Contact a health care provider if:  You are getting worse rather than better.  Your symptoms are not controlled by medicine.  You have chills.  You have worsening shortness of breath.  You have brown or red mucus.  You have yellow or brown nasal discharge.  You have pain in your face, especially when you bend forward.  You have a fever.  You have swollen neck  glands.  You have pain while swallowing.  You have white areas in the back of your throat. Get help right away if:  You have severe or persistent:  Headache.  Ear pain.  Sinus pain.  Chest pain.  You have chronic lung disease and any of the following:  Wheezing.  Prolonged cough.  Coughing up blood.  A change in your usual mucus.  You have a stiff neck.  You have changes in your:  Vision.  Hearing.  Thinking.  Mood. This information is not intended to replace advice given to you by your health care provider. Make sure you discuss any questions you have with your health care provider. Document Released: 05/03/2001 Document Revised: 07/10/2016 Document Reviewed: 02/12/2014 Elsevier Interactive Patient Education  2017 [employer].   IF you received an x-ray today, you will receive an invoice from [facility]. Please contact [facility] at [phone] with questions or concerns regarding your invoice.   IF you received labwork today, you will receive an invoice from [employer]. Please contact [facility] at [phone] with questions or concerns regarding your invoice.   Our billing staff will not be able to assist you with questions regarding bills from these companies.  You will be contacted with the lab results as soon as they are available. The fastest way to get your results is to activate your My Chart account. Instructions are located on the last page of this paperwork. If you have not heard from [place] regarding the results in 2 weeks, please contact this office.

## 2016-10-04 NOTE — Telephone Encounter (Signed)
Peter Townsend called from a doctors appointment and wanted to know the results of the biopsies taken on 09/01/2016. I told him that H-Pylori was confirmed on the biopsy and that two medications had been sent to the pharmacy. He states that he had not picked it up yet but states that he would do that today. I told him that the treatment would make him feel better. I also told him that he should receive a letter that was mailed yesterday. Patient verbalizes understanding. [name] was busy in [place] so I called the patient on her behalf.   Riki Sheer, LPN

## 2016-10-04 NOTE — Telephone Encounter (Signed)
Patient picked up a sample of Pylera earlier today.

## 2016-10-04 NOTE — Progress Notes (Signed)
By signing my name below, I, Peter Townsend, attest that this documentation has been prepared under the direction and in the presence of Peter Ray, MD.  Electronically Signed: Verlee Townsend, Medical Scribe. 10/04/16. 1:12 PM.  Subjective:    Patient ID: Peter Townsend, male    DOB: 04-05-1981, 35 y.o.   MRN: [id number]  HPI Chief Complaint  Patient presents with  . URI    runny nose, sore throat bilateral ear pain, dizzy x 1 week     HPI Comments: Peter Townsend is a 35 y.o. male who presents to the [facility] complaining of rhinorrhea onset 6 days ago. Reports sore throat, ear pain, and congestion. He hasn't taken anything for his symptoms. Denies fever, loss of appetite, diarrhea, and vomiting.  Dizziness: Reports intermittent dizziness before his congestion for a week. Pt mentions he's felt dizziness after he eats.     There are no active problems to display for this patient.  History reviewed. No pertinent past medical history. Past Surgical History:  Procedure Laterality Date  . COLONOSCOPY  09/01/2016   Pyrtle- low grade carcinoid   No Known Allergies Prior to Admission medications   Medication Sig Start Date End Date Taking? Authorizing Provider  bismuth-metronidazole-tetracycline [facility]) [phone] MG capsule Take 3 capsules by mouth 4 (four) times daily -  before meals and at bedtime. 10/03/16  Yes Jerene Bears, MD  omeprazole (PRILOSEC) 20 MG capsule Take 1 capsule (20 mg total) by mouth 2 (two) times daily before a meal. 10/03/16  Yes Jerene Bears, MD   Social History   Social History  . Marital status: Married    Spouse name: N/A  . Number of children: N/A  . Years of education: N/A   Occupational History  . Not on file.   Social History Main Topics  . Smoking status: Never Smoker  . Smokeless tobacco: Never Used  . Alcohol use No  . Drug use: No  . Sexual activity: Not on file   Other Topics Concern  . Not on file    Social History Narrative  . No narrative on file   Review of Systems  Constitutional: Negative for appetite change and fever.  HENT: Positive for ear pain, rhinorrhea and sore throat.   Neurological: Positive for dizziness.   Objective:  Physical Exam  Constitutional: He is oriented to person, place, and time. He appears well-developed and well-nourished.  HENT:  Head: Normocephalic and atraumatic.  Right Ear: Tympanic membrane, external ear and ear canal normal.  Left Ear: Tympanic membrane, external ear and ear canal normal.  Nose: No rhinorrhea.  Mouth/Throat: Oropharynx is clear and moist and mucous membranes are normal. No oropharyngeal exudate or posterior oropharyngeal erythema.  Eyes: Conjunctivae are normal. Pupils are equal, round, and reactive to light.  Neck: Neck supple.  Cardiovascular: Normal rate, regular rhythm, normal heart sounds and intact distal pulses.   No murmur heard. Pulmonary/Chest: Effort normal and breath sounds normal. He has no wheezes. He has no rhonchi. He has no rales.  Abdominal: Soft. There is no tenderness.  Lymphadenopathy:    He has no cervical adenopathy.  Neurological: He is alert and oriented to person, place, and time.  Skin: Skin is warm and dry. No rash noted.  Psychiatric: He has a normal mood and affect. His behavior is normal.  Vitals reviewed.   Vitals:   10/04/16 1203  BP: 114/80  Pulse: (!) 56  Resp: 16  Temp: 98.3 F (36.8  C)  TempSrc: Oral  SpO2: 100%  Weight: 180 lb 9.6 oz (81.9 kg)  Height: 5\' 11"  (1.803 m)     Results for orders placed or performed in visit on 10/04/16  POCT CBC  Result Value Ref Range   WBC 5.1 4.6 - 10.2 K/uL   Lymph, poc 1.5 0.6 - 3.4   POC LYMPH PERCENT 28.8 10 - 50 %L   MID (cbc) 0.3 0 - 0.9   POC MID % 6.4 0 - 12 %M   POC Granulocyte 3.3 2 - 6.9   Granulocyte percent 64.8 37 - 80 %G   RBC 5.00 4.69 - 6.13 M/uL   Hemoglobin 15.0 14.1 - 18.1 g/dL   HCT, POC 42.5 (A) 43.5 - 53.7 %    MCV 85.0 80 - 97 fL   MCH, POC 29.9 27 - 31.2 pg   MCHC 35.2 31.8 - 35.4 g/dL   RDW, POC 12.5 %   Platelet Count, POC 232 142 - 424 K/uL   MPV 6.9 0 - 99.8 fL  POCT glucose (manual entry)  Result Value Ref Range   POC Glucose 95 70 - 99 mg/dl   EKG Reading: Sinus  Bradycardia -Horizontal axis for age.        Assessment & Plan:   [name] is a 35 y.o. male Dizziness - Plan: POCT CBC, POCT glucose (manual entry), EKG 12-Lead Bradycardia - Plan: EKG 12-Lead  - Borderline bradycardia on EKG, but reassuring vital signs otherwise. Glucose and CBC reassuring. Suspected middle ear/congestion cause of dizziness. Symptomatic care discussed, RTC/ER precautions if acute worsening or not improving with treatment of upper respiratory infection.  Acute upper respiratory infection  -Suspected viral syndrome. Symptomatic care discussed, RTC precautions  No orders of the defined types were placed in this encounter.  Patient Instructions   Your blood count, blood sugar, and EKG are without any acute findings today. See information below on dizziness, but I suspect some this may be due to sinus congestion. Salt water nasal spray 4-5 times per day, Mucinex as needed for cough. Drink plenty fluids and rest. Return to the clinic or go to the nearest emergency room if any of your symptoms worsen or new symptoms occur.   Dizziness Dizziness is a common problem. It is a feeling of unsteadiness or light-headedness. You may feel like you are about to faint. Dizziness can lead to injury if you stumble or fall. Anyone can become dizzy, but dizziness is more common in older adults. This condition can be caused by a number of things, including medicines, dehydration, or illness. Follow these instructions at home: Taking these steps may help with your condition: Eating and drinking  Drink enough fluid to keep your urine clear or pale yellow. This helps to keep you from becoming dehydrated. Try to  drink more clear fluids, such as water.  Do not drink alcohol.  Limit your caffeine intake if directed by your health care provider.  Limit your salt intake if directed by your health care provider. Activity  Avoid making quick movements.  Rise slowly from chairs and steady yourself until you feel okay.  In the morning, first sit up on the side of the bed. When you feel okay, stand slowly while you hold onto something until you know that your balance is fine.  Move your legs often if you need to stand in one place for a long time. Tighten and relax your muscles in your legs while you are standing.  Do  not drive or operate heavy machinery if you feel dizzy.  Avoid bending down if you feel dizzy. Place items in your home so that they are easy for you to reach without leaning over. Lifestyle  Do not use any tobacco products, including cigarettes, chewing tobacco, or electronic cigarettes. If you need help quitting, ask your health care provider.  Try to reduce your stress level, such as with yoga or meditation. Talk with your health care provider if you need help. General instructions  Watch your dizziness for any changes.  Take medicines only as directed by your health care provider. Talk with your health care provider if you think that your dizziness is caused by a medicine that you are taking.  Tell a friend or a family member that you are feeling dizzy. If he or she notices any changes in your behavior, have this person call your health care provider.  Keep all follow-up visits as directed by your health care provider. This is important. Contact a health care provider if:  Your dizziness does not go away.  Your dizziness or light-headedness gets worse.  You feel nauseous.  You have reduced hearing.  You have new symptoms.  You are unsteady on your feet or you feel like the room is spinning. Get help right away if:  You vomit or have diarrhea and are unable to eat or  drink anything.  You have problems talking, walking, swallowing, or using your arms, hands, or legs.  You feel generally weak.  You are not thinking clearly or you have trouble forming sentences. It may take a friend or family member to notice this.  You have chest pain, abdominal pain, shortness of breath, or sweating.  Your vision changes.  You notice any bleeding.  You have a headache.  You have neck pain or a stiff neck.  You have a fever. This information is not intended to replace advice given to you by your health care provider. Make sure you discuss any questions you have with your health care provider. Document Released: 05/03/2001 Document Revised: 04/14/2016 Document Reviewed: 11/03/2014 Elsevier Interactive Patient Education  2017 [place]    Upper Respiratory Infection, Adult Most upper respiratory infections (URIs) are a viral infection of the air passages leading to the lungs. A URI affects the nose, throat, and upper air passages. The most common type of URI is nasopharyngitis and is typically referred to as "the common cold." URIs run their course and usually go away on their own. Most of the time, a URI does not require medical attention, but sometimes a bacterial infection in the upper airways can follow a viral infection. This is called a secondary infection. Sinus and middle ear infections are common types of secondary upper respiratory infections. Bacterial pneumonia can also complicate a URI. A URI can worsen asthma and chronic obstructive pulmonary disease (COPD). Sometimes, these complications can require emergency medical care and may be life threatening. What are the causes? Almost all URIs are caused by viruses. A virus is a type of germ and can spread from one person to another. What increases the risk? You may be at risk for a URI if:  You smoke.  You have chronic heart or lung disease.  You have a weakened defense (immune) system.  You are  very young or very old.  You have nasal allergies or asthma.  You work in crowded or poorly ventilated areas.  You work in health care facilities or schools. What are the signs  or symptoms? Symptoms typically develop 2-3 days after you come in contact with a cold virus. Most viral URIs last 7-10 days. However, viral URIs from the influenza virus (flu virus) can last 14-18 days and are typically more severe. Symptoms may include:  Runny or stuffy (congested) nose.  Sneezing.  Cough.  Sore throat.  Headache.  Fatigue.  Fever.  Loss of appetite.  Pain in your forehead, behind your eyes, and over your cheekbones (sinus pain).  Muscle aches. How is this diagnosed? Your health care provider may diagnose a URI by:  Physical exam.  Tests to check that your symptoms are not due to another condition such as:  Strep throat.  Sinusitis.  Pneumonia.  Asthma. How is this treated? A URI goes away on its own with time. It cannot be cured with medicines, but medicines may be prescribed or recommended to relieve symptoms. Medicines may help:  Reduce your fever.  Reduce your cough.  Relieve nasal congestion. Follow these instructions at home:  Take medicines only as directed by your health care provider.  Gargle warm saltwater or take cough drops to comfort your throat as directed by your health care provider.  Use a warm mist humidifier or inhale steam from a shower to increase air moisture. This may make it easier to breathe.  Drink enough fluid to keep your urine clear or pale yellow.  Eat soups and other clear broths and maintain good nutrition.  Rest as needed.  Return to work when your temperature has returned to normal or as your health care provider advises. You may need to stay home longer to avoid infecting others. You can also use a face mask and careful hand washing to prevent spread of the virus.  Increase the usage of your inhaler if you have  asthma.  Do not use any tobacco products, including cigarettes, chewing tobacco, or electronic cigarettes. If you need help quitting, ask your health care provider. How is this prevented? The best way to protect yourself from getting a cold is to practice good hygiene.  Avoid oral or hand contact with people with cold symptoms.  Wash your hands often if contact occurs. There is no clear evidence that vitamin C, vitamin E, echinacea, or exercise reduces the chance of developing a cold. However, it is always recommended to get plenty of rest, exercise, and practice good nutrition. Contact a health care provider if:  You are getting worse rather than better.  Your symptoms are not controlled by medicine.  You have chills.  You have worsening shortness of breath.  You have brown or red mucus.  You have yellow or brown nasal discharge.  You have pain in your face, especially when you bend forward.  You have a fever.  You have swollen neck glands.  You have pain while swallowing.  You have white areas in the back of your throat. Get help right away if:  You have severe or persistent:  Headache.  Ear pain.  Sinus pain.  Chest pain.  You have chronic lung disease and any of the following:  Wheezing.  Prolonged cough.  Coughing up blood.  A change in your usual mucus.  You have a stiff neck.  You have changes in your:  Vision.  Hearing.  Thinking.  Mood. This information is not intended to replace advice given to you by your health care provider. Make sure you discuss any questions you have with your health care provider. Document Released: 05/03/2001 Document Revised: 07/10/2016 Document  Reviewed: 02/12/2014 Elsevier Interactive Patient Education  2017 [employer].   IF you received an x-Townsend today, you will receive an invoice from [facility]. Please contact [facility] at [phone] with questions or concerns regarding your  invoice.   IF you received labwork today, you will receive an invoice from [employer]. Please contact [facility] at [phone] with questions or concerns regarding your invoice.   Our billing staff will not be able to assist you with questions regarding bills from these companies.  You will be contacted with the lab results as soon as they are available. The fastest way to get your results is to activate your My Chart account. Instructions are located on the last page of this paperwork. If you have not heard from [place] regarding the results in 2 weeks, please contact this office.         I personally performed the services described in this documentation, which was scribed in my presence. The recorded information has been reviewed and considered, and addended by me as needed.   Signed,   Peter Ray, MD [facility].  10/06/16 3:19 PM

## 2016-12-05 ENCOUNTER — Encounter: Payer: BLUE CROSS/BLUE SHIELD | Admitting: Internal Medicine

## 2017-01-16 ENCOUNTER — Ambulatory Visit (INDEPENDENT_AMBULATORY_CARE_PROVIDER_SITE_OTHER): Payer: BLUE CROSS/BLUE SHIELD | Admitting: Family

## 2017-01-16 ENCOUNTER — Encounter: Payer: Self-pay | Admitting: Family

## 2017-01-16 VITALS — BP 108/68 | HR 51 | Temp 98.2°F | Resp 16 | Ht 71.0 in | Wt 186.0 lb

## 2017-01-16 DIAGNOSIS — A048 Other specified bacterial intestinal infections: Secondary | ICD-10-CM | POA: Insufficient documentation

## 2017-01-16 DIAGNOSIS — K219 Gastro-esophageal reflux disease without esophagitis: Secondary | ICD-10-CM | POA: Diagnosis not present

## 2017-01-16 NOTE — Progress Notes (Signed)
Subjective:    Patient ID: Peter Townsend, male    DOB: 11-23-1980, 36 y.o.   MRN: [id number]  Chief Complaint  Patient presents with  . Establish Care    had an endoscopy in november and was diagnosed with h. pylori, was told to follow up and get retested     HPI:  Peter Townsend is a 36 y.o. male who  has a past medical history of H pylori ulcer. and presents today for an office visit to establish care.  Recently underwent an endoscopy and diagnosed with H. Pylori infection and was treated with Pylera. Reports taking the medication as prescribed and denies adverse side effects. He reports that his symptoms have improved with the only associated symptom being some gas on occasion. No abdominal pain, nausea, vomiting, diarrhea, constipation or blood in his stool. He reports eating an adequate oral intake without difficulty. Continues to have the associated symptom of reflux on occasion.   No Known Allergies    Outpatient Medications Prior to Visit  Medication Sig Dispense Refill  . bismuth-metronidazole-tetracycline (PYLERA) 140-125-125 MG capsule Take 3 capsules by mouth 4 (four) times daily -  before meals and at bedtime. 120 capsule 0  . omeprazole (PRILOSEC) 20 MG capsule Take 1 capsule (20 mg total) by mouth 2 (two) times daily before a meal. 20 capsule 0  . 0.9 %  sodium chloride infusion     . 0.9 %  sodium chloride infusion     . 0.9 %  sodium chloride infusion      No facility-administered medications prior to visit.      Past Medical History:  Diagnosis Date  . H pylori ulcer       Past Surgical History:  Procedure Laterality Date  . COLONOSCOPY  09/01/2016   Pyrtle- low grade carcinoid      Family History  Problem Relation Age of Onset  . Diabetes Mother   . Colon cancer Father 75  . Colon polyps Brother   . Colon polyps Brother   . Esophageal cancer Neg Hx   . Stomach cancer Neg Hx   . Rectal cancer Neg Hx       Social History   Social History    . Marital status: Married    Spouse name: N/A  . Number of children: 2  . Years of education: 16   Occupational History  . Not on file.   Social History Main Topics  . Smoking status: Never Smoker  . Smokeless tobacco: Never Used  . Alcohol use No  . Drug use: No  . Sexual activity: Not on file   Other Topics Concern  . Not on file   Social History Narrative   Fun/Hobby: Exercise       Review of Systems  Constitutional: Negative for chills and fever.  Respiratory: Negative for chest tightness and shortness of breath.   Cardiovascular: Negative for chest pain, palpitations and leg swelling.  Gastrointestinal: Negative for abdominal distention, abdominal pain, anal bleeding, blood in stool, constipation, diarrhea, nausea, rectal pain and vomiting.       Objective:    BP 108/68 (BP Location: Left Arm, Patient Position: Sitting, Cuff Size: Normal)   Pulse (!) 51   Temp 98.2 F (36.8 C) (Oral)   Resp 16   Ht 5\' 11"  (1.803 m)   Wt 186 lb (84.4 kg)   SpO2 97%   BMI 25.94 kg/m  Nursing note and vital signs reviewed.  Physical Exam  Constitutional:  He is oriented to person, place, and time. He appears well-developed and well-nourished. No distress.  Cardiovascular: Normal rate, regular rhythm, normal heart sounds and intact distal pulses.   Pulmonary/Chest: Effort normal and breath sounds normal.  Abdominal: Soft. Bowel sounds are normal. He exhibits no distension and no mass. There is no tenderness. There is no rebound and no guarding.  Neurological: He is alert and oriented to person, place, and time.  Skin: Skin is warm and dry.  Psychiatric: He has a normal mood and affect. His behavior is normal. Judgment and thought content normal.        Assessment & Plan:   Problem List Items Addressed This Visit      Digestive   Gastroesophageal reflux disease without esophagitis    Symptoms of gastroesophageal reflux appear adequately control with no current  medications. Continue with lifestyle management and information provided regarding GERD related dietary intake and ways to reduce symptoms. Continue to monitor.        Other   H. pylori infection - Primary    Previously tested positive for H. pylori Darin endoscopy and treated with Pylera. Completed medication with no adverse side effects and no current symptoms. Obtain H. pylori stool to check for eradication. Follow-up pending culture results.      Relevant Orders   H. pylori antigen, stool       I have discontinued Mr. Abbs's bismuth-metronidazole-tetracycline and omeprazole. We will stop administering sodium chloride, sodium chloride, and sodium chloride.   Follow-up: Return in about 1 month (around 02/13/2017), or if symptoms worsen or fail to improve.  Mauricio Po, FNP

## 2017-01-16 NOTE — Assessment & Plan Note (Signed)
Previously tested positive for H. pylori Darin endoscopy and treated with Pylera. Completed medication with no adverse side effects and no current symptoms. Obtain H. pylori stool to check for eradication. Follow-up pending culture results.

## 2017-01-16 NOTE — Assessment & Plan Note (Signed)
Symptoms of gastroesophageal reflux appear adequately control with no current medications. Continue with lifestyle management and information provided regarding GERD related dietary intake and ways to reduce symptoms. Continue to monitor.

## 2017-01-16 NOTE — Patient Instructions (Addendum)
Thank you for choosing [facility].  SUMMARY AND INSTRUCTIONS:  Follow up with GI as scheduled.   Medication:  For gas issues recommend Gaviscon or Gas-X products.  Continue OTC antacids as needed for reflux.  Your prescription(s) have been submitted to your pharmacy or been printed and provided for you. Please take as directed and contact our office if you believe you are having problem(s) with the medication(s) or have any questions.  Labs:  Please stop by the lab on the lower level of the building for your blood work. Your results will be released to [facility] (or called to you) after review, usually within 72 hours after test completion. If any changes need to be made, you will be notified at that same time.  1.) The lab is open from 7:30am to 5:30 pm Monday-Friday 2.) No appointment is necessary 3.) Fasting (if needed) is 6-8 hours after food and drink; black coffee and water are okay   Follow up:  If your symptoms worsen or fail to improve, please contact our office for further instruction, or in case of emergency go directly to the emergency room at the closest medical facility.    Food Choices for Gastroesophageal Reflux Disease, Adult When you have gastroesophageal reflux disease (GERD), the foods you eat and your eating habits are very important. Choosing the right foods can help ease your discomfort. What guidelines do I need to follow?  Choose fruits, vegetables, whole grains, and low-fat dairy products.  Choose low-fat meat, fish, and poultry.  Limit fats such as oils, salad dressings, butter, nuts, and avocado.  Keep a food diary. This helps you identify foods that cause symptoms.  Avoid foods that cause symptoms. These may be different for everyone.  Eat small meals often instead of 3 large meals a day.  Eat your meals slowly, in a place where you are relaxed.  Limit fried foods.  Cook foods using methods other than frying.  Avoid drinking  alcohol.  Avoid drinking large amounts of liquids with your meals.  Avoid bending over or lying down until 2-3 hours after eating. What foods are not recommended? These are some foods and drinks that may make your symptoms worse: Vegetables  Tomatoes. Tomato juice. Tomato and spaghetti sauce. Chili peppers. Onion and garlic. Horseradish. Fruits  Oranges, grapefruit, and lemon (fruit and juice). Meats  High-fat meats, fish, and poultry. This includes hot dogs, ribs, ham, sausage, salami, and bacon. Dairy  Whole milk and chocolate milk. Sour cream. Cream. Butter. Ice cream. Cream cheese. Drinks  Coffee and tea. Bubbly (carbonated) drinks or energy drinks. Condiments  Hot sauce. Barbecue sauce. Sweets/Desserts  Chocolate and cocoa. Donuts. Peppermint and spearmint. Fats and Oils  High-fat foods. This includes [place] fries and potato chips. Other  Vinegar. Strong spices. This includes black pepper, white pepper, red pepper, cayenne, curry powder, cloves, ginger, and chili powder. The items listed above may not be a complete list of foods and drinks to avoid. Contact your dietitian for more information.  This information is not intended to replace advice given to you by your health care provider. Make sure you discuss any questions you have with your health care provider. Document Released: 05/08/2012 Document Revised: 04/14/2016 Document Reviewed: 09/11/2013 Elsevier Interactive Patient Education  2017 [employer].

## 2017-01-17 ENCOUNTER — Other Ambulatory Visit: Payer: BLUE CROSS/BLUE SHIELD

## 2017-01-17 DIAGNOSIS — A048 Other specified bacterial intestinal infections: Secondary | ICD-10-CM

## 2017-01-20 ENCOUNTER — Encounter: Payer: Self-pay | Admitting: Family

## 2017-01-20 LAB — H. PYLORI ANTIGEN, STOOL: H pylori Ag, Stl: NEGATIVE

## 2017-06-07 ENCOUNTER — Encounter: Payer: Self-pay | Admitting: Internal Medicine

## 2017-10-10 ENCOUNTER — Encounter: Payer: Self-pay | Admitting: Family

## 2017-10-16 ENCOUNTER — Telehealth: Payer: Self-pay | Admitting: Internal Medicine

## 2017-10-17 ENCOUNTER — Other Ambulatory Visit: Payer: Self-pay

## 2017-10-17 ENCOUNTER — Telehealth: Payer: Self-pay

## 2017-10-17 DIAGNOSIS — R918 Other nonspecific abnormal finding of lung field: Secondary | ICD-10-CM

## 2017-10-17 NOTE — Telephone Encounter (Signed)
-----   Message from Jerene Bears, MD sent at 10/17/2017  4:00 PM EST ----- Regarding: RE: CT Chest His insurance coverage denied repeat CT scan to follow-up on the 4 mm subpleural lung nodule. Both he and and our practice will receive the denial letter They stated the lesion did not meet criteria for follow-up They suggested that he follow-up with primary care to discuss whether or not this needs to be evaluated further. My recommendation is that he follow-up with PCP rather than do nothing further. Copy this to his permanent chart Thanks  Thanks JMP   ----- Message ----- From: Darden Dates Sent: 10/17/2017  10:48 AM To: Jerene Bears, MD Subject: CT Chest                                       Hey Dr. Hilarie Fredrickson, Its me... :)  I need a peer to peer on this pt for pt for CT chest with contrast.  I spoke to nurse and gave clinical info and its still asking for peer to peer. Could you call when you have a moment? [phone] AIM  Member# [id number] DOB June 23, 1981 He's scheduled for Friday. Thanks! Amy

## 2017-10-17 NOTE — Telephone Encounter (Signed)
Yes, CT chest to eval previously seen small lung nodule

## 2017-10-17 NOTE — Telephone Encounter (Signed)
Dr. Hilarie Fredrickson does pt just need a CT of chest? Please advise.

## 2017-10-17 NOTE — Telephone Encounter (Signed)
Pt notified by mychart

## 2017-10-17 NOTE — Telephone Encounter (Signed)
CT of Chest scheduled at [facility] 10/19/17@10 :30am, pt to arrive there at 10:15am. Pt to have no solid food after 8:30am. Pt notified of appt via mychart message.

## 2017-10-19 ENCOUNTER — Inpatient Hospital Stay: Admission: RE | Admit: 2017-10-19 | Payer: BLUE CROSS/BLUE SHIELD | Source: Ambulatory Visit

## 2017-10-19 ENCOUNTER — Encounter: Payer: Self-pay | Admitting: Family

## 2017-10-26 ENCOUNTER — Other Ambulatory Visit (INDEPENDENT_AMBULATORY_CARE_PROVIDER_SITE_OTHER): Payer: BLUE CROSS/BLUE SHIELD

## 2017-10-26 ENCOUNTER — Encounter: Payer: Self-pay | Admitting: Internal Medicine

## 2017-10-26 ENCOUNTER — Ambulatory Visit: Payer: BLUE CROSS/BLUE SHIELD | Admitting: Internal Medicine

## 2017-10-26 VITALS — BP 122/78 | HR 64 | Temp 98.3°F | Resp 16 | Ht 71.0 in | Wt 193.0 lb

## 2017-10-26 DIAGNOSIS — R911 Solitary pulmonary nodule: Secondary | ICD-10-CM

## 2017-10-26 DIAGNOSIS — R0789 Other chest pain: Secondary | ICD-10-CM | POA: Diagnosis not present

## 2017-10-26 DIAGNOSIS — R1084 Generalized abdominal pain: Secondary | ICD-10-CM

## 2017-10-26 DIAGNOSIS — D3A026 Benign carcinoid tumor of the rectum: Secondary | ICD-10-CM

## 2017-10-26 DIAGNOSIS — R001 Bradycardia, unspecified: Secondary | ICD-10-CM

## 2017-10-26 LAB — COMPREHENSIVE METABOLIC PANEL
ALK PHOS: 53 U/L (ref 39–117)
ALT: 22 U/L (ref 0–53)
AST: 18 U/L (ref 0–37)
Albumin: 4.6 g/dL (ref 3.5–5.2)
BILIRUBIN TOTAL: 1.2 mg/dL (ref 0.2–1.2)
BUN: 15 mg/dL (ref 6–23)
CO2: 29 mEq/L (ref 19–32)
CREATININE: 0.76 mg/dL (ref 0.40–1.50)
Calcium: 9.5 mg/dL (ref 8.4–10.5)
Chloride: 102 mEq/L (ref 96–112)
GFR: 149.2 mL/min (ref >60.00)
Glucose, Bld: 91 mg/dL (ref 70–99)
POTASSIUM: 3.9 meq/L (ref 3.5–5.1)
SODIUM: 139 meq/L (ref 135–145)
Total Protein: 6.8 g/dL (ref 6.0–8.3)

## 2017-10-26 LAB — CBC WITH DIFFERENTIAL/PLATELET
BASOS ABS: 0.1 10*3/uL (ref 0.0–0.1)
BASOS PCT: 1.1 % (ref 0.0–3.0)
EOS ABS: 0.1 10*3/uL (ref 0.0–0.7)
Eosinophils Relative: 3.1 % (ref 0.0–5.0)
HEMATOCRIT: 42.2 % (ref 39.0–52.0)
Hemoglobin: 14.8 g/dL (ref 13.0–17.0)
LYMPHS ABS: 1.9 10*3/uL (ref 0.7–4.0)
LYMPHS PCT: 39.7 % (ref 12.0–46.0)
MCHC: 35.1 g/dL (ref 30.0–36.0)
MCV: 84.4 fl (ref 78.0–100.0)
MONO ABS: 0.4 10*3/uL (ref 0.1–1.0)
Monocytes Relative: 8.9 % (ref 3.0–12.0)
NEUTROS ABS: 2.3 10*3/uL (ref 1.4–7.7)
NEUTROS PCT: 47.2 % (ref 43.0–77.0)
PLATELETS: 269 10*3/uL (ref 150.0–400.0)
RBC: 5 Mil/uL (ref 4.22–5.81)
RDW: 12.9 % (ref 11.5–15.5)
WBC: 4.8 10*3/uL (ref 4.0–10.5)

## 2017-10-26 NOTE — Patient Instructions (Signed)

## 2017-10-26 NOTE — Progress Notes (Signed)
Subjective:  Patient ID: Peter Townsend, male    DOB: 08-13-1981  Age: 36 y.o. MRN: [id number]  CC: Abdominal Pain and Chest Pain  NEW TO ME  HPI Thaddeaus Defilippo presents for a several month history of left-sided anterior chest wall pain that he describes as a sharp pain with movement and use of left upper extremity.  He has also had intermittent diffuse abdominal pain that he describes as an achy sensation.  He has a history of carcinoid and had a rectal tumor resected about a year ago.  On a prior CT he was noted to have a 4 mm nodule in his left lower lung.  He denies any recent episodes of cough, shortness of breath, weight loss, nausea, vomiting, diarrhea, constipation, dysuria, or hematuria.  No outpatient medications prior to visit.   No facility-administered medications prior to visit.     ROS Review of Systems  Constitutional: Negative for chills, diaphoresis, fatigue and fever.  HENT: Negative.  Negative for facial swelling and trouble swallowing.   Eyes: Negative for visual disturbance.  Respiratory: Negative for cough and chest tightness.   Cardiovascular: Positive for chest pain. Negative for palpitations and leg swelling.  Gastrointestinal: Positive for abdominal pain. Negative for blood in stool, constipation, diarrhea, nausea and vomiting.  Endocrine: Negative.  Negative for cold intolerance and heat intolerance.  Genitourinary: Negative.  Negative for difficulty urinating, dysuria, flank pain, hematuria and urgency.  Musculoskeletal: Negative.  Negative for arthralgias, back pain, myalgias and neck pain.  Skin: Negative.  Negative for color change and rash.  Allergic/Immunologic: Negative.   Neurological: Negative.   Hematological: Negative for adenopathy. Does not bruise/bleed easily.  Psychiatric/Behavioral: Negative.     Objective:  BP 122/78   Pulse 64   Temp 98.3 F (36.8 C) (Oral)   Resp 16   Ht 5\' 11"  (1.803 m)   Wt 193 lb (87.5 kg)   SpO2 99%   BMI  26.92 kg/m   BP Readings from Last 3 Encounters:  10/26/17 122/78  01/16/17 108/68  10/04/16 114/80    Wt Readings from Last 3 Encounters:  10/26/17 193 lb (87.5 kg)  01/16/17 186 lb (84.4 kg)  10/04/16 180 lb 9.6 oz (81.9 kg)    Physical Exam  Constitutional: He is oriented to person, place, and time.  Non-toxic appearance. He does not have a sickly appearance. He does not appear ill. No distress.  HENT:  Mouth/Throat: Oropharynx is clear and moist. No oropharyngeal exudate.  Eyes: Conjunctivae are normal. Right eye exhibits no discharge. Left eye exhibits no discharge. No scleral icterus.  Neck: Normal range of motion. Neck supple. No JVD present. No thyromegaly present.  Cardiovascular: Regular rhythm and normal heart sounds. Bradycardia present.  No murmur heard. EKG ---  Sinus  Bradycardia  WITHIN NORMAL LIMITS   Pulmonary/Chest: Effort normal and breath sounds normal. No respiratory distress. He has no rales. He exhibits no tenderness.  Abdominal: Soft. Bowel sounds are normal. He exhibits no distension and no mass. There is no rebound.  Musculoskeletal: Normal range of motion. He exhibits no edema, tenderness or deformity.  Lymphadenopathy:    He has no cervical adenopathy.  Neurological: He is alert and oriented to person, place, and time.  Skin: Skin is warm and dry. No rash noted. He is not diaphoretic. No erythema. No pallor.  Vitals reviewed.   Lab Results  Component Value Date   WBC 4.8 10/26/2017   HGB 14.8 10/26/2017   HCT 42.2 10/26/2017  PLT 269.0 10/26/2017   GLUCOSE 91 10/26/2017   ALT 22 10/26/2017   AST 18 10/26/2017   NA 139 10/26/2017   K 3.9 10/26/2017   CL 102 10/26/2017   CREATININE 0.76 10/26/2017   BUN 15 10/26/2017   CO2 29 10/26/2017    Ct Chest W Contrast  Result Date: 09/23/2016 CLINICAL DATA:  Rectal carcinoid. EXAM: CT CHEST, ABDOMEN, AND PELVIS WITH CONTRAST TECHNIQUE: Multidetector CT imaging of the chest, abdomen and  pelvis was performed following the standard protocol during bolus administration of intravenous contrast. CONTRAST:  179mL ISOVUE-300 IOPAMIDOL (ISOVUE-300) INJECTION 61% COMPARISON:  None. FINDINGS: CT CHEST FINDINGS Cardiovascular: Vascular structures are unremarkable. Heart size normal. No pericardial effusion. Mediastinum/Nodes: Triangular-shaped soft tissue in the prevascular space is indicative of thymus. No pathologically enlarged mediastinal, hilar or axillary lymph nodes. Esophagus is grossly unremarkable. Lungs/Pleura: Probable focal mucoid impaction in the superior segment left lower lobe (series 3, image 71). 4 mm subpleural nodule in the superior segment left lower lobe (image 74). No pleural fluid. Airway is unremarkable. Musculoskeletal: No worrisome lytic or sclerotic lesions. CT ABDOMEN PELVIS FINDINGS Hepatobiliary: Liver and gallbladder are unremarkable. No biliary ductal dilatation. Pancreas: Negative. Spleen: Negative. Adrenals/Urinary Tract: Adrenal glands and kidneys are unremarkable. Ureters are decompressed. Bladder is unremarkable. Stomach/Bowel: Stomach, small bowel, appendix and colon are unremarkable. Rectum is collapsed, limiting evaluation. No perirectal adenopathy. Vascular/Lymphatic: Vascular structures are unremarkable. No pathologically enlarged lymph nodes. Again, no perirectal adenopathy. Reproductive: Prostate is visualized. Other: No free fluid.  Mesenteries and peritoneum are unremarkable. Musculoskeletal: No worrisome lytic or sclerotic lesions. IMPRESSION: 1. Rectum is collapsed, limiting evaluation. No perirectal adenopathy. No definitive evidence of distant metastatic disease. 2. 4 mm subpleural left lower lobe nodule, likely benign. Attention on followup exams is warranted. Electronically Signed   By: Lorin Picket M.D.   On: 09/23/2016 09:48   Ct Abdomen Pelvis W Contrast  Result Date: 09/23/2016 CLINICAL DATA:  Rectal carcinoid. EXAM: CT CHEST, ABDOMEN, AND PELVIS  WITH CONTRAST TECHNIQUE: Multidetector CT imaging of the chest, abdomen and pelvis was performed following the standard protocol during bolus administration of intravenous contrast. CONTRAST:  123mL ISOVUE-300 IOPAMIDOL (ISOVUE-300) INJECTION 61% COMPARISON:  None. FINDINGS: CT CHEST FINDINGS Cardiovascular: Vascular structures are unremarkable. Heart size normal. No pericardial effusion. Mediastinum/Nodes: Triangular-shaped soft tissue in the prevascular space is indicative of thymus. No pathologically enlarged mediastinal, hilar or axillary lymph nodes. Esophagus is grossly unremarkable. Lungs/Pleura: Probable focal mucoid impaction in the superior segment left lower lobe (series 3, image 71). 4 mm subpleural nodule in the superior segment left lower lobe (image 74). No pleural fluid. Airway is unremarkable. Musculoskeletal: No worrisome lytic or sclerotic lesions. CT ABDOMEN PELVIS FINDINGS Hepatobiliary: Liver and gallbladder are unremarkable. No biliary ductal dilatation. Pancreas: Negative. Spleen: Negative. Adrenals/Urinary Tract: Adrenal glands and kidneys are unremarkable. Ureters are decompressed. Bladder is unremarkable. Stomach/Bowel: Stomach, small bowel, appendix and colon are unremarkable. Rectum is collapsed, limiting evaluation. No perirectal adenopathy. Vascular/Lymphatic: Vascular structures are unremarkable. No pathologically enlarged lymph nodes. Again, no perirectal adenopathy. Reproductive: Prostate is visualized. Other: No free fluid.  Mesenteries and peritoneum are unremarkable. Musculoskeletal: No worrisome lytic or sclerotic lesions. IMPRESSION: 1. Rectum is collapsed, limiting evaluation. No perirectal adenopathy. No definitive evidence of distant metastatic disease. 2. 4 mm subpleural left lower lobe nodule, likely benign. Attention on followup exams is warranted. Electronically Signed   By: Lorin Picket M.D.   On: 09/23/2016 09:48    Assessment & Plan:   Keelon was  seen today for  abdominal pain and chest pain.  Diagnoses and all orders for this visit:  Carcinoid tumor of rectum- He has some unusual symptoms that may be related to a carcinoid tumor but it is definitely time for him to undergo repeat scanning to see if there has been a recurrence. -     CT Chest W Contrast; Future -     CT Abdomen Pelvis W Contrast; Future  Nodule of lower lobe of left lung- Will get a repeat CT scan to see if there has been a malignant transformation.  Other than the carcinoid he is low risk.  If the CT scan confirms a benign nature then he will not need any further screening. -     CT Chest W Contrast; Future  Atypical chest pain-his EKG is significant for bradycardia but there are no ischemic changes.  His examination symptoms are consistent with musculoskeletal chest wall pain.  He will take ibuprofen and Tylenol as needed for symptom relief. -     EKG 12-Lead -     CT Chest W Contrast; Future  Generalized abdominal pain- His exam and labs are normal.  As noted below, he will undergo repeat CT scan of the abdomen and pelvis with contrast to screen for recurrence of carcinoid. -     Comprehensive metabolic panel; Future -     CBC with Differential/Platelet; Future -     CT Abdomen Pelvis W Contrast; Future  Other chest pain  Bradycardia- His heart rate is 51 on the EKG today.  Initial lab work is negative for any secondary causes.  Will check TFTs to see if there is any concern for hypothyroidism. -     Thyroid Panel With TSH; Future   Kristy Stelzner does not currently have medications on file.  No orders of the defined types were placed in this encounter.    Follow-up: Return in about 3 weeks (around 11/16/2017).  Scarlette Calico, MD

## 2017-10-28 ENCOUNTER — Encounter: Payer: Self-pay | Admitting: Internal Medicine

## 2017-10-28 DIAGNOSIS — R001 Bradycardia, unspecified: Secondary | ICD-10-CM | POA: Insufficient documentation

## 2017-11-02 ENCOUNTER — Other Ambulatory Visit: Payer: BLUE CROSS/BLUE SHIELD

## 2017-11-02 DIAGNOSIS — R001 Bradycardia, unspecified: Secondary | ICD-10-CM

## 2017-11-02 LAB — THYROID PANEL WITH TSH
Free Thyroxine Index: 2.4 (ref 1.4–3.8)
T3 Uptake: 28 % (ref 22–35)
T4, Total: 8.7 ug/dL (ref 4.9–10.5)
TSH: 3.47 m[IU]/L (ref 0.40–4.50)

## 2017-11-03 ENCOUNTER — Encounter: Payer: Self-pay | Admitting: Internal Medicine

## 2017-11-06 ENCOUNTER — Ambulatory Visit (INDEPENDENT_AMBULATORY_CARE_PROVIDER_SITE_OTHER)
Admission: RE | Admit: 2017-11-06 | Discharge: 2017-11-06 | Disposition: A | Discharge status: Home / Self Care | Payer: BLUE CROSS/BLUE SHIELD | Source: Ambulatory Visit | Attending: Internal Medicine | Admitting: Internal Medicine

## 2017-11-06 DIAGNOSIS — D3A026 Benign carcinoid tumor of the rectum: Secondary | ICD-10-CM

## 2017-11-06 DIAGNOSIS — R0789 Other chest pain: Secondary | ICD-10-CM

## 2017-11-06 DIAGNOSIS — R911 Solitary pulmonary nodule: Secondary | ICD-10-CM | POA: Diagnosis not present

## 2017-11-06 DIAGNOSIS — R1084 Generalized abdominal pain: Secondary | ICD-10-CM | POA: Diagnosis not present

## 2017-11-06 MED ORDER — IOPAMIDOL (ISOVUE-300) INJECTION 61%
100.0000 mL | Freq: Once | INTRAVENOUS | Status: AC | PRN
  Administered 2017-11-06: 100 mL via INTRAVENOUS

## 2017-11-07 ENCOUNTER — Encounter: Payer: Self-pay | Admitting: Internal Medicine

## 2017-11-15 ENCOUNTER — Ambulatory Visit: Payer: Self-pay | Admitting: *Deleted

## 2017-11-15 ENCOUNTER — Encounter: Payer: Self-pay | Admitting: Internal Medicine

## 2017-11-15 NOTE — Telephone Encounter (Signed)
I have transferred patient to [facility] to be triaged by RN before scheduling

## 2017-11-15 NOTE — Telephone Encounter (Signed)
Patient complains of dizziness and weakness- he feels tired. He has heaviness in his head and some nasal congestion. Patient states his heart rate is low- 47/54 BPM. Patient reports no temperature, no headache. Patient has felt bad for 1-2 weeks. Multiple symptoms.  Reason for Disposition . [1] Weakness of arm / hand, or leg / foot AND [2] is a chronic symptom (recurrent or ongoing AND present > 4 weeks)  Answer Assessment - Initial Assessment Questions 1. SYMPTOM: "What is the main symptom you are concerned about?" (e.g., weakness, numbness)     Weakness- "numbness in fingers" Congestion that makes it hard to breath 2. ONSET: "When did this start?" (minutes, hours, days; while sleeping)     1-2 weeks ago- got worse last night 3. LAST NORMAL: "When was the last time you were normal (no symptoms)?"     Over 1 month ago 4. PATTERN "Does this come and go, or has it been constant since it started?"  "Is it present now?"     Comes and goes- patient works and he feels it when he stops 5. CARDIAC SYMPTOMS: "Have you had any of the following symptoms: chest pain, difficulty breathing, palpitations?"     no 6. NEUROLOGIC SYMPTOMS: "Have you had any of the following symptoms: headache, dizziness, vision loss, double vision, changes in speech, unsteady on your feet?"     Dizziness sometimes 7. OTHER SYMPTOMS: "Do you have any other symptoms?"     Patient feels slight pain in chest when he takes a deep breath- feels a little dizzy too. 8. PREGNANCY: "Is there any chance you are pregnant?" "When was your last menstrual period?"     n/a  Protocols used: NEUROLOGIC DEFICIT-A-AH

## 2017-11-16 ENCOUNTER — Encounter: Payer: Self-pay | Admitting: Internal Medicine

## 2017-11-16 ENCOUNTER — Ambulatory Visit: Payer: BLUE CROSS/BLUE SHIELD | Admitting: Internal Medicine

## 2017-11-16 VITALS — BP 116/70 | HR 66 | Temp 98.3°F | Resp 16 | Ht 71.0 in | Wt 198.5 lb

## 2017-11-16 DIAGNOSIS — J069 Acute upper respiratory infection, unspecified: Secondary | ICD-10-CM | POA: Insufficient documentation

## 2017-11-16 DIAGNOSIS — Z23 Encounter for immunization: Secondary | ICD-10-CM | POA: Diagnosis not present

## 2017-11-16 MED ORDER — CHLORPHEN-PE-ACETAMINOPHEN 4-10-325 MG PO TABS: 1.0000 | ORAL_TABLET | Freq: Four times a day (QID) | ORAL | 0 refills | Status: DC | PRN

## 2017-11-16 NOTE — Patient Instructions (Signed)

## 2017-11-16 NOTE — Progress Notes (Signed)
Subjective:  Patient ID: Peter Townsend, male    DOB: Apr 15, 1981  Age: 36 y.o. MRN: [id number]  CC: URI and Sore Throat   HPI Peter Townsend presents for a 3-day history of sore throat and nasal congestion.  He denies cough, lymphadenopathy, fever, or chills.  No outpatient medications prior to visit.   No facility-administered medications prior to visit.     ROS Review of Systems  Constitutional: Negative for chills, diaphoresis, fatigue and fever.  HENT: Positive for congestion and sore throat. Negative for facial swelling, sinus pressure, trouble swallowing and voice change.   Eyes: Negative.   Respiratory: Negative.  Negative for cough and shortness of breath.   Cardiovascular: Negative.  Negative for chest pain.  Gastrointestinal: Negative for abdominal pain, constipation, diarrhea, nausea and vomiting.  Endocrine: Negative.   Genitourinary: Negative.   Musculoskeletal: Negative.  Negative for back pain, myalgias and neck pain.  Skin: Negative.  Negative for color change and rash.  Allergic/Immunologic: Negative.   Neurological: Negative.  Negative for headaches.  Hematological: Negative for adenopathy. Does not bruise/bleed easily.  Psychiatric/Behavioral: Negative.     Objective:  BP 116/70 (BP Location: Left Arm, Patient Position: Sitting, Cuff Size: Normal)   Pulse 66   Temp 98.3 F (36.8 C) (Oral)   Resp 16   Ht 5\' 11"  (1.803 m)   Wt 198 lb 8 oz (90 kg)   SpO2 98%   BMI 27.69 kg/m   BP Readings from Last 3 Encounters:  11/16/17 116/70  10/26/17 122/78  01/16/17 108/68    Wt Readings from Last 3 Encounters:  11/16/17 198 lb 8 oz (90 kg)  10/26/17 193 lb (87.5 kg)  01/16/17 186 lb (84.4 kg)    Physical Exam  Constitutional: He is oriented to person, place, and time.  Non-toxic appearance. He does not have a sickly appearance. He does not appear ill. No distress.  HENT:  Mouth/Throat: Mucous membranes are normal. Mucous membranes are not pale, not dry  and not cyanotic. No oral lesions. No trismus in the jaw. No uvula swelling. Posterior oropharyngeal erythema present. No oropharyngeal exudate, posterior oropharyngeal edema or tonsillar abscesses.  Eyes: Conjunctivae are normal. Left eye exhibits no discharge. No scleral icterus.  Neck: Normal range of motion. Neck supple. No JVD present. No thyromegaly present.  Cardiovascular: Normal rate, regular rhythm and normal heart sounds.  No murmur heard. Pulmonary/Chest: Effort normal and breath sounds normal. No respiratory distress. He has no wheezes. He has no rales.  Abdominal: Soft. Bowel sounds are normal. He exhibits no mass. There is no tenderness.  Musculoskeletal: Normal range of motion. He exhibits no edema or tenderness.  Lymphadenopathy:       Head (right side): No submental, no tonsillar and no occipital adenopathy present.       Head (left side): No submental, no tonsillar and no occipital adenopathy present.    He has no cervical adenopathy.    He has no axillary adenopathy.       Right: No supraclavicular adenopathy present.       Left: No supraclavicular adenopathy present.  Neurological: He is alert and oriented to person, place, and time.  Skin: Skin is warm and dry. No rash noted. He is not diaphoretic. No erythema. No pallor.  Vitals reviewed.   Lab Results  Component Value Date   WBC 4.8 10/26/2017   HGB 14.8 10/26/2017   HCT 42.2 10/26/2017   PLT 269.0 10/26/2017   GLUCOSE 91 10/26/2017   ALT  22 10/26/2017   AST 18 10/26/2017   NA 139 10/26/2017   K 3.9 10/26/2017   CL 102 10/26/2017   CREATININE 0.76 10/26/2017   BUN 15 10/26/2017   CO2 29 10/26/2017   TSH 3.47 11/02/2017    Ct Chest W Contrast  Result Date: 11/07/2017 CLINICAL DATA:  Left upper quadrant abdominal pain. Left lower lobe nodule. History of carcinoid tumor of the rectum. EXAM: CT CHEST, ABDOMEN, AND PELVIS WITH CONTRAST TECHNIQUE: Multidetector CT imaging of the chest, abdomen and pelvis was  performed following the standard protocol during bolus administration of intravenous contrast. CONTRAST:  150mL ISOVUE-300 IOPAMIDOL (ISOVUE-300) INJECTION 61% COMPARISON:  09/23/2016 FINDINGS: CT CHEST FINDINGS Cardiovascular: Unremarkable Mediastinum/Nodes: Unremarkable Lungs/Pleura: 2 by 3 mm left lower lobe pulmonary nodule on image 68/3, stable from 09/23/2016. 3 by 4 mm left lower lobe pulmonary nodule on image 71/3, likewise stable. Triangular subpleural nodule in the right upper lobe measuring 0.5 by 0.5 cm on image 48/3, stable. No new or enlarging pulmonary nodules. Musculoskeletal: Unremarkable CT ABDOMEN PELVIS FINDINGS Hepatobiliary: Unremarkable Pancreas: Unremarkable Spleen: Unremarkable Adrenals/Urinary Tract: Unremarkable Stomach/Bowel: No current rectal or perirectal mass/adenopathy. Vascular/Lymphatic: Mesenteric lymph nodes measure up to 0.9 cm in short axis as on image 75/2, mildly more prominent than on the prior exam but without surrounding cicatricial reaction or other specific worrisome features. Reproductive: Unremarkable Other: No supplemental non-categorized findings. Musculoskeletal: Unremarkable IMPRESSION: 1. There are 3 small pulmonary nodules at or below 5 mm in diameter, all stable from the prior exam and most likely to be postinflammatory. We have now documented 13 months of stability. No new or enlarging pulmonary nodules. 2. Mesenteric lymph nodes are in the upper normal size range. No cicatricial reaction or other findings of recurrent carcinoid tumor. 3. A specific cause for the patient's left upper quadrant abdominal pain is not identified. Electronically Signed   By: Van Clines M.D.   On: 11/07/2017 08:19   Ct Abdomen Pelvis W Contrast  Result Date: 11/07/2017 CLINICAL DATA:  Left upper quadrant abdominal pain. Left lower lobe nodule. History of carcinoid tumor of the rectum. EXAM: CT CHEST, ABDOMEN, AND PELVIS WITH CONTRAST TECHNIQUE: Multidetector CT imaging of  the chest, abdomen and pelvis was performed following the standard protocol during bolus administration of intravenous contrast. CONTRAST:  169mL ISOVUE-300 IOPAMIDOL (ISOVUE-300) INJECTION 61% COMPARISON:  09/23/2016 FINDINGS: CT CHEST FINDINGS Cardiovascular: Unremarkable Mediastinum/Nodes: Unremarkable Lungs/Pleura: 2 by 3 mm left lower lobe pulmonary nodule on image 68/3, stable from 09/23/2016. 3 by 4 mm left lower lobe pulmonary nodule on image 71/3, likewise stable. Triangular subpleural nodule in the right upper lobe measuring 0.5 by 0.5 cm on image 48/3, stable. No new or enlarging pulmonary nodules. Musculoskeletal: Unremarkable CT ABDOMEN PELVIS FINDINGS Hepatobiliary: Unremarkable Pancreas: Unremarkable Spleen: Unremarkable Adrenals/Urinary Tract: Unremarkable Stomach/Bowel: No current rectal or perirectal mass/adenopathy. Vascular/Lymphatic: Mesenteric lymph nodes measure up to 0.9 cm in short axis as on image 75/2, mildly more prominent than on the prior exam but without surrounding cicatricial reaction or other specific worrisome features. Reproductive: Unremarkable Other: No supplemental non-categorized findings. Musculoskeletal: Unremarkable IMPRESSION: 1. There are 3 small pulmonary nodules at or below 5 mm in diameter, all stable from the prior exam and most likely to be postinflammatory. We have now documented 13 months of stability. No new or enlarging pulmonary nodules. 2. Mesenteric lymph nodes are in the upper normal size range. No cicatricial reaction or other findings of recurrent carcinoid tumor. 3. A specific cause for the patient's left upper  quadrant abdominal pain is not identified. Electronically Signed   By: Van Clines M.D.   On: 11/07/2017 08:19    Assessment & Plan:   Maliek was seen today for uri and sore throat.  Diagnoses and all orders for this visit:  Viral URI- will offer symptomatic relief as needed -     Chlorphen-PE-Acetaminophen (NOREL AD) 4-10-325 MG  TABS; Take 1 tablet by mouth every 6 (six) hours as needed.  Need for influenza vaccination -     Flu Vaccine QUAD 6+ mos PF IM (Fluarix Quad PF)   I am having Viraj Banes start on Chlorphen-PE-Acetaminophen.  Meds ordered this encounter  Medications  . Chlorphen-PE-Acetaminophen (NOREL AD) 4-10-325 MG TABS    Sig: Take 1 tablet by mouth every 6 (six) hours as needed.    Dispense:  84 tablet    Refill:  0     Follow-up: Return if symptoms worsen or fail to improve.  Scarlette Calico, MD

## 2018-03-01 ENCOUNTER — Other Ambulatory Visit (INDEPENDENT_AMBULATORY_CARE_PROVIDER_SITE_OTHER): Payer: 59

## 2018-03-01 ENCOUNTER — Encounter: Payer: Self-pay | Admitting: Internal Medicine

## 2018-03-01 ENCOUNTER — Ambulatory Visit (INDEPENDENT_AMBULATORY_CARE_PROVIDER_SITE_OTHER): Payer: 59 | Admitting: Internal Medicine

## 2018-03-01 VITALS — BP 122/70 | HR 58 | Temp 98.1°F | Resp 16 | Ht 71.0 in | Wt 193.0 lb

## 2018-03-01 DIAGNOSIS — R5383 Other fatigue: Secondary | ICD-10-CM

## 2018-03-01 DIAGNOSIS — R001 Bradycardia, unspecified: Secondary | ICD-10-CM

## 2018-03-01 DIAGNOSIS — J301 Allergic rhinitis due to pollen: Secondary | ICD-10-CM | POA: Diagnosis not present

## 2018-03-01 LAB — CBC WITH DIFFERENTIAL/PLATELET
Basophils Absolute: 0.1 10*3/uL (ref 0.0–0.1)
Basophils Relative: 1.4 % (ref 0.0–3.0)
EOS PCT: 2.9 % (ref 0.0–5.0)
Eosinophils Absolute: 0.1 10*3/uL (ref 0.0–0.7)
HCT: 42.5 % (ref 39.0–52.0)
Hemoglobin: 15.1 g/dL (ref 13.0–17.0)
LYMPHS ABS: 1.6 10*3/uL (ref 0.7–4.0)
Lymphocytes Relative: 41.8 % (ref 12.0–46.0)
MCHC: 35.6 g/dL (ref 30.0–36.0)
MCV: 83.9 fl (ref 78.0–100.0)
Monocytes Absolute: 0.4 10*3/uL (ref 0.1–1.0)
Monocytes Relative: 9 % (ref 3.0–12.0)
NEUTROS ABS: 1.8 10*3/uL (ref 1.4–7.7)
NEUTROS PCT: 44.9 % (ref 43.0–77.0)
PLATELETS: 271 10*3/uL (ref 150.0–400.0)
RBC: 5.07 Mil/uL (ref 4.22–5.81)
RDW: 12.6 % (ref 11.5–15.5)
WBC: 3.9 10*3/uL — ABNORMAL LOW (ref 4.0–10.5)

## 2018-03-01 LAB — SEDIMENTATION RATE: SED RATE: 3 mm/h (ref 0–15)

## 2018-03-01 LAB — COMPREHENSIVE METABOLIC PANEL
ALT: 22 U/L (ref 0–53)
AST: 16 U/L (ref 0–37)
Albumin: 4.8 g/dL (ref 3.5–5.2)
Alkaline Phosphatase: 54 U/L (ref 39–117)
BUN: 15 mg/dL (ref 6–23)
CO2: 29 meq/L (ref 19–32)
Calcium: 9.5 mg/dL (ref 8.4–10.5)
Chloride: 104 mEq/L (ref 96–112)
Creatinine, Ser: 0.77 mg/dL (ref 0.40–1.50)
GFR: 146.68 mL/min (ref >60.00)
Glucose, Bld: 94 mg/dL (ref 70–99)
POTASSIUM: 4.5 meq/L (ref 3.5–5.1)
SODIUM: 138 meq/L (ref 135–145)
TOTAL PROTEIN: 7.4 g/dL (ref 6.0–8.3)
Total Bilirubin: 1.2 mg/dL (ref 0.2–1.2)

## 2018-03-01 MED ORDER — MOMETASONE FUROATE 50 MCG/ACT NA SUSP: 4.0000 | Freq: Every day | NASAL | 5 refills | Status: DC

## 2018-03-01 MED ORDER — METHYLPREDNISOLONE 4 MG PO TBPK: ORAL_TABLET | ORAL | 0 refills | Status: AC

## 2018-03-01 NOTE — Progress Notes (Signed)
Subjective:  Patient ID: Peter Townsend, male    DOB: June 28, 1981  Age: 37 y.o. MRN: [id number]  CC: Fatigue   HPI [name] presents for concerns about a several week history of fatigue and to follow-up on his history of low heart rate.  He denies any recent episodes of palpitations, near syncope, lightheadedness, dizziness, edema, chest pain or shortness of breath, or DOE.  He just has a vague sensation of feeling fatigued throughout the day.  He also complains of a one-month history of nasal congestion, sneezing, and postnasal drip.  Outpatient Medications Prior to Visit  Medication Sig Dispense Refill  . Chlorphen-PE-Acetaminophen (NOREL AD) 4-10-325 MG TABS Take 1 tablet by mouth every 6 (six) hours as needed. (Patient not taking: Reported on 03/01/2018) 84 tablet 0   No facility-administered medications prior to visit.     ROS Review of Systems  Constitutional: Positive for fatigue. Negative for activity change, appetite change, diaphoresis and unexpected weight change.  HENT: Positive for congestion, postnasal drip, rhinorrhea and sneezing. Negative for facial swelling, sinus pressure, sinus pain, sore throat, tinnitus, trouble swallowing and voice change.   Eyes: Negative.   Respiratory: Negative.  Negative for cough, chest tightness, shortness of breath and wheezing.   Cardiovascular: Negative for chest pain, palpitations and leg swelling.  Gastrointestinal: Negative.  Negative for abdominal pain, constipation and vomiting.  Endocrine: Negative.   Genitourinary: Negative.   Musculoskeletal: Negative.   Skin: Negative.  Negative for pallor and rash.  Allergic/Immunologic: Negative.   Neurological: Negative.  Negative for dizziness, syncope, weakness, light-headedness and headaches.  Hematological: Negative for adenopathy. Does not bruise/bleed easily.  Psychiatric/Behavioral: Negative.     Objective:  BP 122/70 (BP Location: Left Arm, Patient Position: Sitting, Cuff  Size: Normal)   Pulse (!) 58   Temp 98.1 F (36.7 C) (Oral)   Resp 16   Ht 5\' 11"  (1.803 m)   Wt 193 lb (87.5 kg)   SpO2 97%   BMI 26.92 kg/m   BP Readings from Last 3 Encounters:  03/01/18 122/70  11/16/17 116/70  10/26/17 122/78    Wt Readings from Last 3 Encounters:  03/01/18 193 lb (87.5 kg)  11/16/17 198 lb 8 oz (90 kg)  10/26/17 193 lb (87.5 kg)    Physical Exam  Constitutional: He is oriented to person, place, and time. No distress.  HENT:  Nose: Mucosal edema present. No rhinorrhea or sinus tenderness. No epistaxis. Right sinus exhibits no maxillary sinus tenderness and no frontal sinus tenderness. Left sinus exhibits no maxillary sinus tenderness and no frontal sinus tenderness.  Mouth/Throat: No oropharyngeal exudate.  Eyes: Conjunctivae are normal.  Neck: Normal range of motion. Neck supple. No JVD present. No thyromegaly present.  Cardiovascular: Regular rhythm, normal heart sounds and intact distal pulses. Bradycardia present. Exam reveals no gallop and no friction rub.  No murmur heard. EKG ---  Sinus  Bradycardia  WITHIN NORMAL LIMITS- no change from the prior EKG   Pulmonary/Chest: Effort normal and breath sounds normal. No stridor. No respiratory distress. He has no wheezes. He has no rales. He exhibits no tenderness.  Abdominal: Soft. Bowel sounds are normal. He exhibits no distension and no mass. There is no tenderness.  Musculoskeletal: Normal range of motion. He exhibits no edema, tenderness or deformity.  Lymphadenopathy:    He has no cervical adenopathy.  Neurological: He is alert and oriented to person, place, and time.  Skin: Skin is warm and dry. He is not diaphoretic.  Vitals  reviewed.   Lab Results  Component Value Date   WBC 3.9 (L) 03/01/2018   HGB 15.1 03/01/2018   HCT 42.5 03/01/2018   PLT 271.0 03/01/2018   GLUCOSE 94 03/01/2018   ALT 22 03/01/2018   AST 16 03/01/2018   NA 138 03/01/2018   K 4.5 03/01/2018   CL 104 03/01/2018     CREATININE 0.77 03/01/2018   BUN 15 03/01/2018   CO2 29 03/01/2018   TSH 2.85 03/01/2018    Ct Chest W Contrast  Result Date: 11/07/2017 CLINICAL DATA:  Left upper quadrant abdominal pain. Left lower lobe nodule. History of carcinoid tumor of the rectum. EXAM: CT CHEST, ABDOMEN, AND PELVIS WITH CONTRAST TECHNIQUE: Multidetector CT imaging of the chest, abdomen and pelvis was performed following the standard protocol during bolus administration of intravenous contrast. CONTRAST:  19mL ISOVUE-300 IOPAMIDOL (ISOVUE-300) INJECTION 61% COMPARISON:  09/23/2016 FINDINGS: CT CHEST FINDINGS Cardiovascular: Unremarkable Mediastinum/Nodes: Unremarkable Lungs/Pleura: 2 by 3 mm left lower lobe pulmonary nodule on image 68/3, stable from 09/23/2016. 3 by 4 mm left lower lobe pulmonary nodule on image 71/3, likewise stable. Triangular subpleural nodule in the right upper lobe measuring 0.5 by 0.5 cm on image 48/3, stable. No new or enlarging pulmonary nodules. Musculoskeletal: Unremarkable CT ABDOMEN PELVIS FINDINGS Hepatobiliary: Unremarkable Pancreas: Unremarkable Spleen: Unremarkable Adrenals/Urinary Tract: Unremarkable Stomach/Bowel: No current rectal or perirectal mass/adenopathy. Vascular/Lymphatic: Mesenteric lymph nodes measure up to 0.9 cm in short axis as on image 75/2, mildly more prominent than on the prior exam but without surrounding cicatricial reaction or other specific worrisome features. Reproductive: Unremarkable Other: No supplemental non-categorized findings. Musculoskeletal: Unremarkable IMPRESSION: 1. There are 3 small pulmonary nodules at or below 5 mm in diameter, all stable from the prior exam and most likely to be postinflammatory. We have now documented 13 months of stability. No new or enlarging pulmonary nodules. 2. Mesenteric lymph nodes are in the upper normal size range. No cicatricial reaction or other findings of recurrent carcinoid tumor. 3. A specific cause for the patient's left  upper quadrant abdominal pain is not identified. Electronically Signed   By: Van Clines M.D.   On: 11/07/2017 08:19   Ct Abdomen Pelvis W Contrast  Result Date: 11/07/2017 CLINICAL DATA:  Left upper quadrant abdominal pain. Left lower lobe nodule. History of carcinoid tumor of the rectum. EXAM: CT CHEST, ABDOMEN, AND PELVIS WITH CONTRAST TECHNIQUE: Multidetector CT imaging of the chest, abdomen and pelvis was performed following the standard protocol during bolus administration of intravenous contrast. CONTRAST:  198mL ISOVUE-300 IOPAMIDOL (ISOVUE-300) INJECTION 61% COMPARISON:  09/23/2016 FINDINGS: CT CHEST FINDINGS Cardiovascular: Unremarkable Mediastinum/Nodes: Unremarkable Lungs/Pleura: 2 by 3 mm left lower lobe pulmonary nodule on image 68/3, stable from 09/23/2016. 3 by 4 mm left lower lobe pulmonary nodule on image 71/3, likewise stable. Triangular subpleural nodule in the right upper lobe measuring 0.5 by 0.5 cm on image 48/3, stable. No new or enlarging pulmonary nodules. Musculoskeletal: Unremarkable CT ABDOMEN PELVIS FINDINGS Hepatobiliary: Unremarkable Pancreas: Unremarkable Spleen: Unremarkable Adrenals/Urinary Tract: Unremarkable Stomach/Bowel: No current rectal or perirectal mass/adenopathy. Vascular/Lymphatic: Mesenteric lymph nodes measure up to 0.9 cm in short axis as on image 75/2, mildly more prominent than on the prior exam but without surrounding cicatricial reaction or other specific worrisome features. Reproductive: Unremarkable Other: No supplemental non-categorized findings. Musculoskeletal: Unremarkable IMPRESSION: 1. There are 3 small pulmonary nodules at or below 5 mm in diameter, all stable from the prior exam and most likely to be postinflammatory. We have now documented 11  months of stability. No new or enlarging pulmonary nodules. 2. Mesenteric lymph nodes are in the upper normal size range. No cicatricial reaction or other findings of recurrent carcinoid tumor. 3. A  specific cause for the patient's left upper quadrant abdominal pain is not identified. Electronically Signed   By: Van Clines M.D.   On: 11/07/2017 08:19    Assessment & Plan:   Aniken was seen today for fatigue.  Diagnoses and all orders for this visit:  Bradycardia- He has mild bradycardia which could be causing his symptoms.  His lab work is negative for any metabolic or secondary causes.  I have asked him to undergo a 30-day event monitor to see if heart his heart rate is dipping down lower than that which is seen on the EKG today.  This may explain his fatigue. -     EKG 12-Lead -     Comprehensive metabolic panel; Future -     CBC with Differential/Platelet; Future -     Thyroid Panel With TSH; Future -     CARDIAC EVENT MONITOR; Future  Fatigue, unspecified type- as above -     Comprehensive metabolic panel; Future -     CBC with Differential/Platelet; Future -     Thyroid Panel With TSH; Future -     Sedimentation rate; Future -     CARDIAC EVENT MONITOR; Future  Seasonal allergic rhinitis due to pollen- He is having a flareup of his symptoms so will treat with a course of systemic steroids.  I have also asked him to start using a steroid nasal spray. -     mometasone (NASONEX) 50 MCG/ACT nasal spray; Place 4 sprays into the nose daily. -     methylPREDNISolone (MEDROL DOSEPAK) 4 MG TBPK tablet; TAKE AS DIRECTED   I am having Cadell Bilski start on mometasone and methylPREDNISolone. I am also having him maintain his Chlorphen-PE-Acetaminophen.  Meds ordered this encounter  Medications  . mometasone (NASONEX) 50 MCG/ACT nasal spray    Sig: Place 4 sprays into the nose daily.    Dispense:  17 g    Refill:  5  . methylPREDNISolone (MEDROL DOSEPAK) 4 MG TBPK tablet    Sig: TAKE AS DIRECTED    Dispense:  21 tablet    Refill:  0     Follow-up: Return in about 2 months (around 05/01/2018).  Scarlette Calico, MD

## 2018-03-01 NOTE — Patient Instructions (Signed)

## 2018-03-02 ENCOUNTER — Encounter: Payer: Self-pay | Admitting: Internal Medicine

## 2018-03-02 LAB — THYROID PANEL WITH TSH
Free Thyroxine Index: 2.6 (ref 1.4–3.8)
T3 Uptake: 29 % (ref 22–35)
T4, Total: 9.1 ug/dL (ref 4.9–10.5)
TSH: 2.85 m[IU]/L (ref 0.40–4.50)

## 2018-03-03 ENCOUNTER — Encounter: Payer: Self-pay | Admitting: Internal Medicine

## 2018-03-20 ENCOUNTER — Telehealth: Payer: Self-pay

## 2018-03-20 NOTE — Telephone Encounter (Signed)
Copied from [place] [id number]. Topic: Referral - Status >> Mar 20, 2018  9:37 AM Synthia Innocent wrote: Reason for CRM: Checking status of Cardiac Event Monitor. Please advise

## 2018-03-28 ENCOUNTER — Ambulatory Visit (INDEPENDENT_AMBULATORY_CARE_PROVIDER_SITE_OTHER): Payer: 59

## 2018-03-28 DIAGNOSIS — R5383 Other fatigue: Secondary | ICD-10-CM

## 2018-03-28 DIAGNOSIS — R001 Bradycardia, unspecified: Secondary | ICD-10-CM

## 2018-04-03 DIAGNOSIS — R001 Bradycardia, unspecified: Secondary | ICD-10-CM | POA: Diagnosis not present

## 2018-05-09 ENCOUNTER — Encounter: Payer: Self-pay | Admitting: Internal Medicine

## 2018-08-14 IMAGING — CT CT CHEST W/ CM
2 of 4 series · 13 of 46 positions shown, 15 images · IV contrast (ISOVUE 300)
Comparison: 09/23/2016

CLINICAL DATA: Left upper quadrant abdominal pain. Left lower lobe
nodule. History of carcinoid tumor of the rectum.

EXAM:
CT CHEST, ABDOMEN, AND PELVIS WITH CONTRAST
TECHNIQUE: Multidetector CT imaging of the chest, abdomen and pelvis was
performed following the standard protocol during bolus
administration of intravenous contrast.
CONTRAST:  100mL QRPMNY-W00 IOPAMIDOL (QRPMNY-W00) INJECTION 61%

[Series 2: cap with · axial · 0.63mm/px · z∈[-653,-78]mm · 10 of 135 slices shown, 12 images]
[im 10/135  soft-tissue]
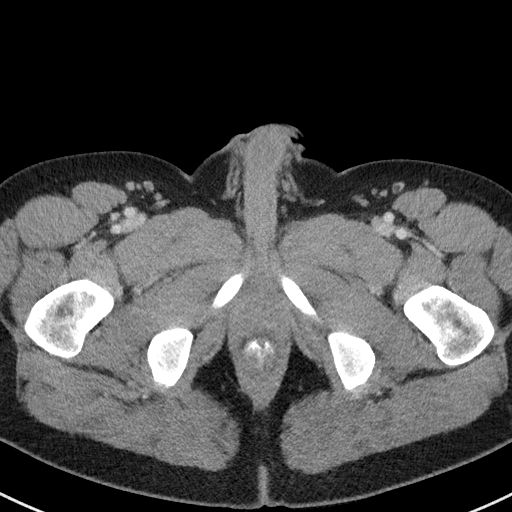
[im 10/135  bone]
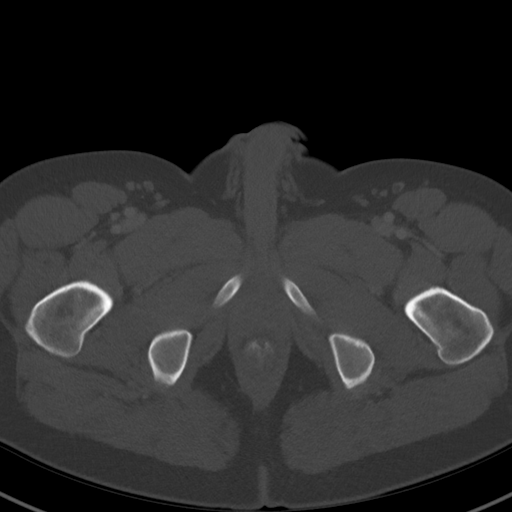
[im 20/135  soft-tissue]
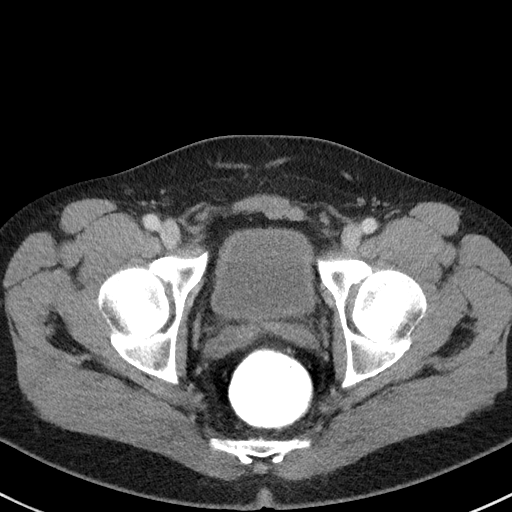
[im 39/135  soft-tissue]
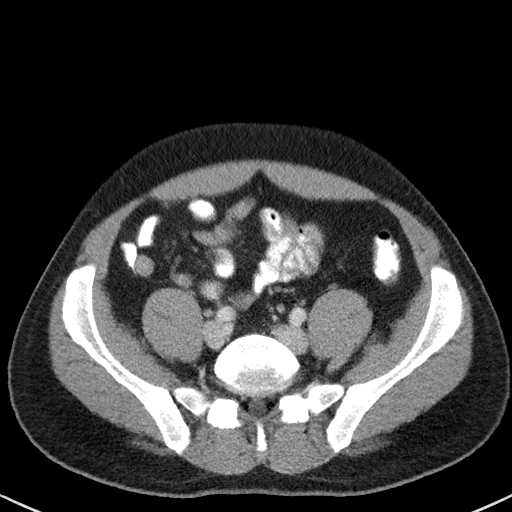
[im 48/135  soft-tissue]
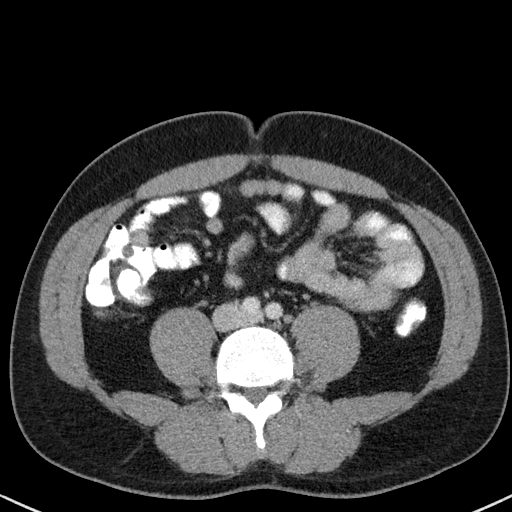
[im 58/135  soft-tissue]
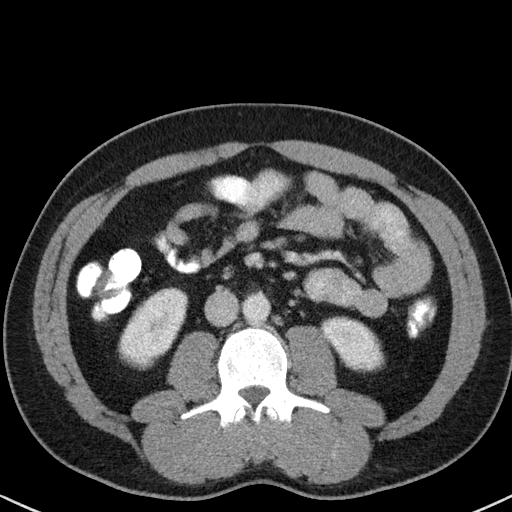
[im 77/135  soft-tissue]
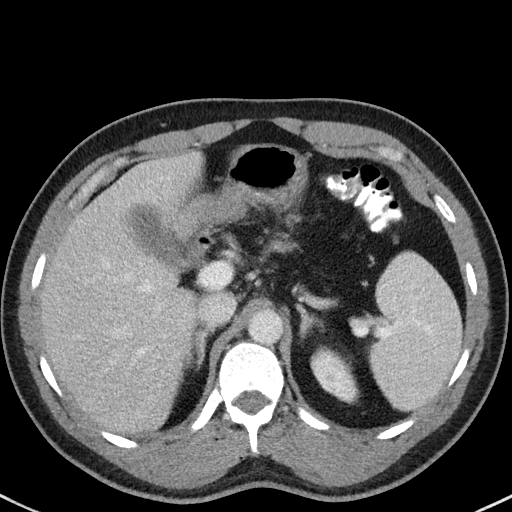
[im 87/135  soft-tissue]
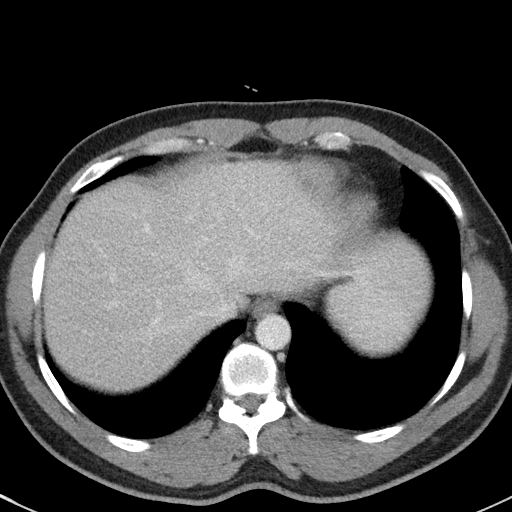
[im 96/135  soft-tissue]
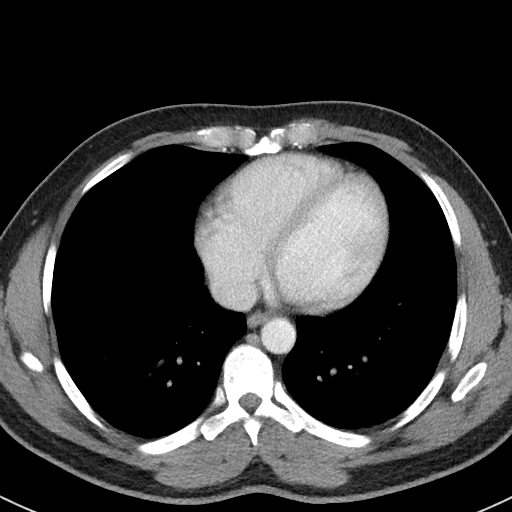
[im 115/135  soft-tissue]
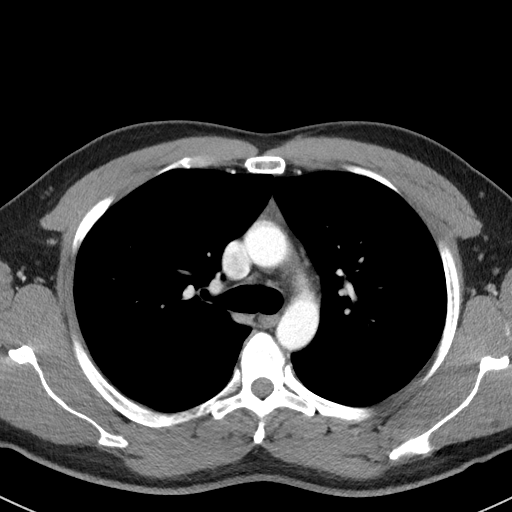
[im 115/135  bone]
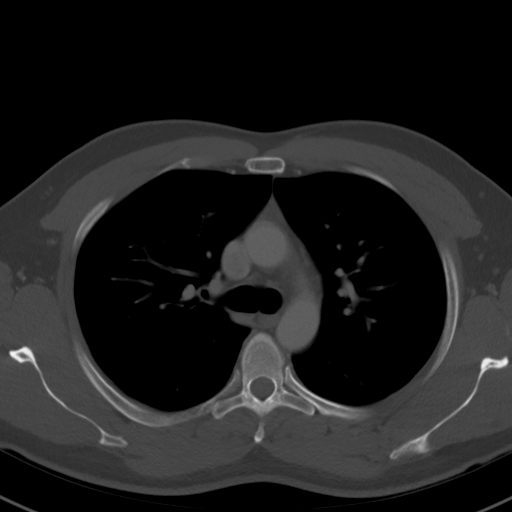
[im 125/135  soft-tissue]
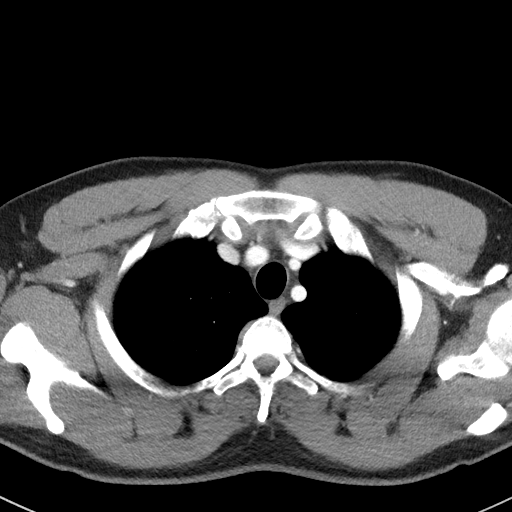

[Series 5: coronal · coronal · 0.63mm/px · 3 of 118 slices shown]
[im 40/118  soft-tissue]
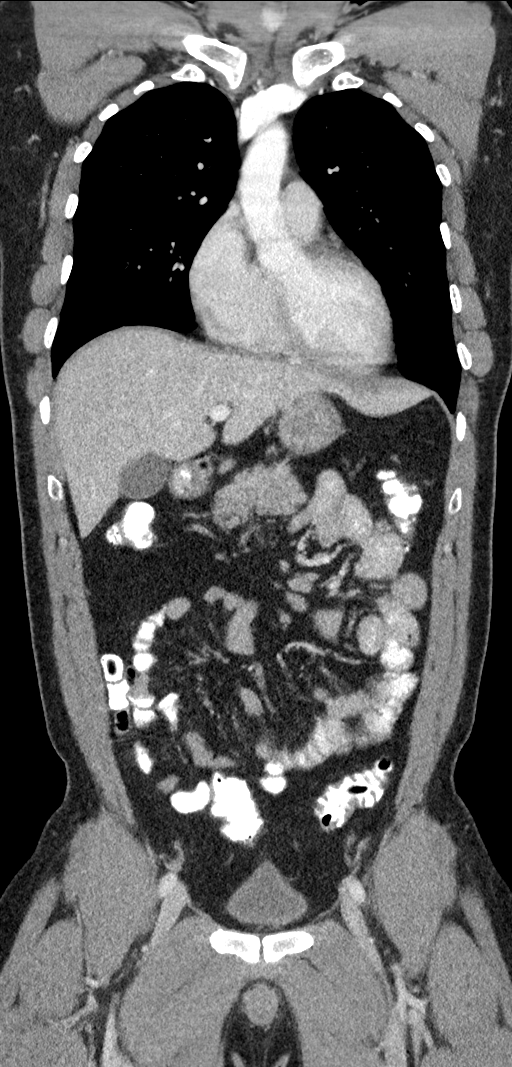
[im 53/118  soft-tissue]
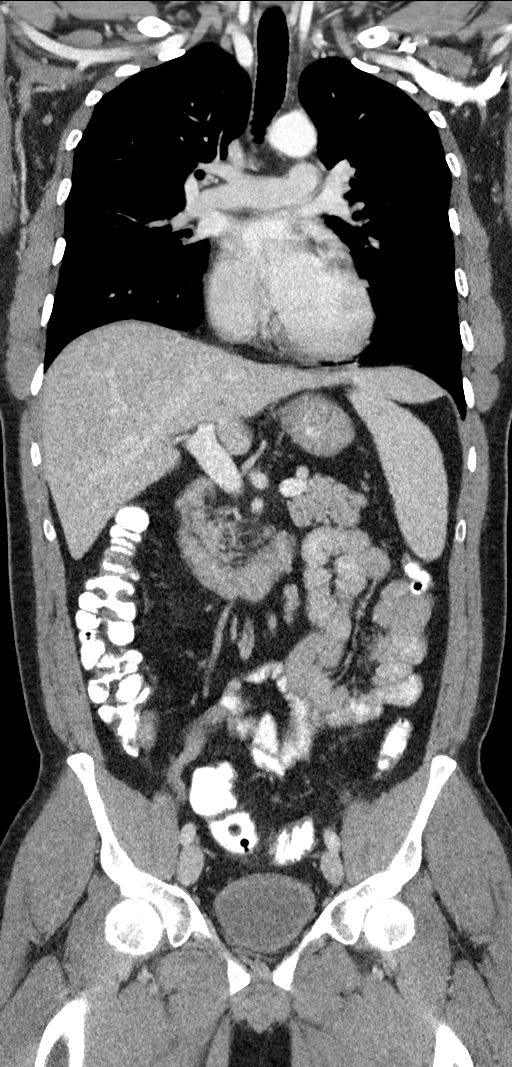
[im 66/118  soft-tissue]
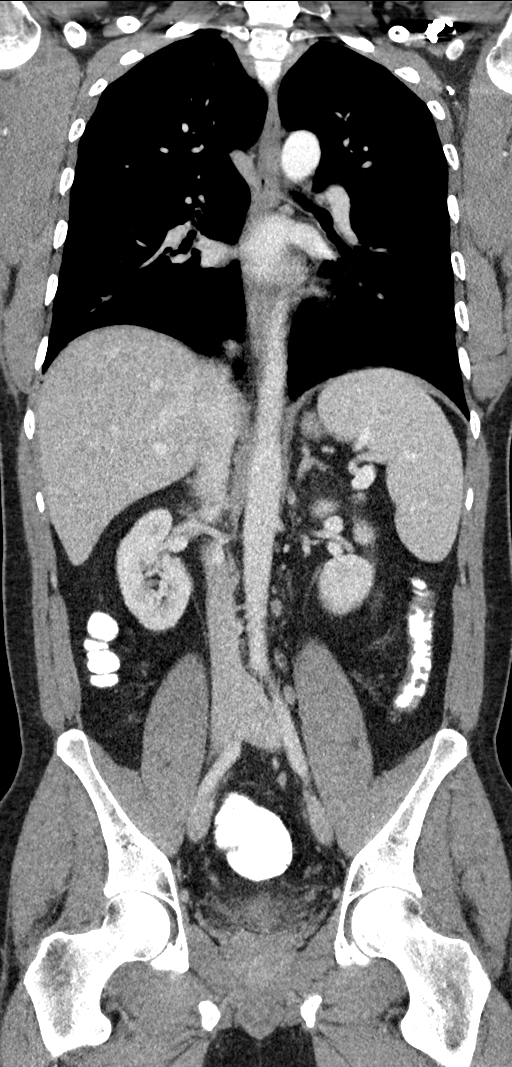

[13 of 46 positions shown; findings below may reference images not displayed]

FINDINGS: CT CHEST FINDINGS

Cardiovascular: Unremarkable

Mediastinum/Nodes: Unremarkable

Lungs/Pleura: 2 by 3 mm left lower lobe pulmonary nodule on image
68/3, stable from 09/23/2016.

3 by 4 mm left lower lobe pulmonary nodule on image 71/3, likewise
stable.

Triangular subpleural nodule in the right upper lobe measuring
by 0.5 cm on image 48/3, stable.

No new or enlarging pulmonary nodules.

Musculoskeletal: Unremarkable

CT ABDOMEN PELVIS FINDINGS

Hepatobiliary: Unremarkable

Pancreas: Unremarkable

Spleen: Unremarkable

Adrenals/Urinary Tract: Unremarkable

Stomach/Bowel: No current rectal or perirectal mass/adenopathy.

Vascular/Lymphatic: Mesenteric lymph nodes measure up to 0.9 cm in
short axis as on image 75/2, mildly more prominent than on the prior
exam but without surrounding cicatricial reaction or other specific
worrisome features.

Reproductive: Unremarkable

Other: No supplemental non-categorized findings.

Musculoskeletal: Unremarkable
IMPRESSION: 1. There are 3 small pulmonary nodules at or below 5 mm in diameter,
all stable from the prior exam and most likely to be
postinflammatory. We have now documented 13 months of stability. No
new or enlarging pulmonary nodules.
2. Mesenteric lymph nodes are in the upper normal size range. No
cicatricial reaction or other findings of recurrent carcinoid tumor.
3. A specific cause for the patient's left upper quadrant abdominal
pain is not identified.

## 2019-08-27 ENCOUNTER — Encounter: Payer: Self-pay | Admitting: Internal Medicine

## 2019-08-29 ENCOUNTER — Encounter: Payer: Self-pay | Admitting: Internal Medicine

## 2019-09-02 ENCOUNTER — Ambulatory Visit: Payer: 59 | Admitting: Internal Medicine

## 2019-09-23 ENCOUNTER — Encounter: Payer: Self-pay | Admitting: Internal Medicine

## 2019-09-23 ENCOUNTER — Ambulatory Visit (AMBULATORY_SURGERY_CENTER): Payer: Self-pay

## 2019-09-23 ENCOUNTER — Other Ambulatory Visit: Payer: Self-pay

## 2019-09-23 VITALS — Temp 96.2°F | Ht 71.0 in | Wt 190.0 lb

## 2019-09-23 DIAGNOSIS — Z86010 Personal history of colonic polyps: Secondary | ICD-10-CM

## 2019-09-23 MED ORDER — NA SULFATE-K SULFATE-MG SULF 17.5-3.13-1.6 GM/177ML PO SOLN: 1.0000 | Freq: Once | ORAL | 0 refills | Status: AC

## 2019-09-23 NOTE — Progress Notes (Signed)
Denies allergies to eggs or soy products. Denies complication of anesthesia or sedation. Denies use of weight loss medication. Denies use of O2.   Emmi instructions given for colonoscopy.  Covid screening is scheduled for 10/02/19 @ 12:10 Pm. A 15.00 coupon for Suprep was given to the patient.

## 2019-10-02 ENCOUNTER — Other Ambulatory Visit: Payer: Self-pay | Admitting: Internal Medicine

## 2019-10-02 ENCOUNTER — Ambulatory Visit (INDEPENDENT_AMBULATORY_CARE_PROVIDER_SITE_OTHER): Payer: 59

## 2019-10-02 DIAGNOSIS — Z1159 Encounter for screening for other viral diseases: Secondary | ICD-10-CM

## 2019-10-03 LAB — SARS CORONAVIRUS 2 (TAT 6-24 HRS): SARS Coronavirus 2: NEGATIVE

## 2019-10-07 ENCOUNTER — Encounter: Payer: Self-pay | Admitting: Internal Medicine

## 2019-10-07 ENCOUNTER — Other Ambulatory Visit: Payer: Self-pay

## 2019-10-07 ENCOUNTER — Ambulatory Visit (AMBULATORY_SURGERY_CENTER): Payer: 59 | Admitting: Internal Medicine

## 2019-10-07 VITALS — BP 108/64 | HR 46 | Temp 96.2°F | Resp 19 | Ht 71.0 in | Wt 190.0 lb

## 2019-10-07 DIAGNOSIS — Z86010 Personal history of colonic polyps: Secondary | ICD-10-CM

## 2019-10-07 DIAGNOSIS — Z85038 Personal history of other malignant neoplasm of large intestine: Secondary | ICD-10-CM | POA: Diagnosis not present

## 2019-10-07 DIAGNOSIS — Z800 Family history of malignant neoplasm of digestive organs: Secondary | ICD-10-CM | POA: Diagnosis not present

## 2019-10-07 MED ORDER — SODIUM CHLORIDE 0.9 % IV SOLN: 500.0000 mL | Freq: Once | INTRAVENOUS | Status: AC

## 2019-10-07 NOTE — Op Note (Signed)
[facility] Patient Name: Peter Townsend Procedure Date: 10/07/2019 11:59 AM MRN: [id number] Endoscopist: Jerene Bears , MD Age: 38 Referring MD:  Date of Birth: June 15, 1981 Gender: Male Account #: [id number] Procedure:                Colonoscopy Indications:              High risk colon cancer surveillance: Personal                            history of rectal carcinoid and family history of                            colon cancer in patient's father, Last colonoscopy                            3 years ago Medicines:                Monitored Anesthesia Care Procedure:                Pre-Anesthesia Assessment:                           - Prior to the procedure, a History and Physical                            was performed, and patient medications and                            allergies were reviewed. The patient's tolerance of                            previous anesthesia was also reviewed. The risks                            and benefits of the procedure and the sedation                            options and risks were discussed with the patient.                            All questions were answered, and informed consent                            was obtained. Prior Anticoagulants: The patient has                            taken no previous anticoagulant or antiplatelet                            agents. ASA Grade Assessment: II - A patient with                            mild systemic disease. After reviewing the risks  and benefits, the patient was deemed in                            satisfactory condition to undergo the procedure.                           After obtaining informed consent, the colonoscope                            was passed under direct vision. Throughout the                            procedure, the patient's blood pressure, pulse, and                            oxygen saturations were monitored continuously. The                           Colonoscope was introduced through the anus and                            advanced to the terminal ileum. The colonoscopy was                            performed without difficulty. The patient tolerated                            the procedure well. The quality of the bowel                            preparation was good. The terminal ileum, ileocecal                            valve, appendiceal orifice, and rectum were                            photographed. Scope In: 12:00:49 PM Scope Out: 12:12:17 PM Scope Withdrawal Time: 0 hours 8 minutes 59 seconds  Total Procedure Duration: 0 hours 11 minutes 28 seconds  Findings:                 The digital rectal exam was normal.                           The terminal ileum appeared normal.                           A post polypectomy scar was found in the rectum                            with tattoo proximal and distal to the scar. The                            scar tissue was healthy in appearance. There was no  evidence of the previous polyp.                           The exam was otherwise without abnormality on                            direct and retroflexion views. Complications:            No immediate complications. Estimated Blood Loss:     Estimated blood loss: none. Impression:               - The examined portion of the ileum was normal.                           - Post-polypectomy scar in the rectum.                           - The examination was otherwise normal on direct                            and retroflexion views.                           - No specimens collected. Recommendation:           - Patient has a contact number available for                            emergencies. The signs and symptoms of potential                            delayed complications were discussed with the                            patient. Return to normal activities tomorrow.                             Written discharge instructions were provided to the                            patient.                           - Resume previous diet.                           - Continue present medications.                           - Repeat colonoscopy in 5 years for surveillance. Jerene Bears, MD 10/07/2019 12:16:38 PM This report has been signed electronically.

## 2019-10-07 NOTE — Patient Instructions (Signed)
Normal exam.  Repeat colonoscopy in 5 years.    YOU HAD AN ENDOSCOPIC PROCEDURE TODAY AT [facility]:   Refer to the procedure report that was given to you for any specific questions about what was found during the examination.  If the procedure report does not answer your questions, please call your gastroenterologist to clarify.  If you requested that your care partner not be given the details of your procedure findings, then the procedure report has been included in a sealed envelope for you to review at your convenience later.  YOU SHOULD EXPECT: Some feelings of bloating in the abdomen. Passage of more gas than usual.  Walking can help get rid of the air that was put into your GI tract during the procedure and reduce the bloating. If you had a lower endoscopy (such as a colonoscopy or flexible sigmoidoscopy) you may notice spotting of blood in your stool or on the toilet paper. If you underwent a bowel prep for your procedure, you may not have a normal bowel movement for a few days.  Please Note:  You might notice some irritation and congestion in your nose or some drainage.  This is from the oxygen used during your procedure.  There is no need for concern and it should clear up in a day or so.  SYMPTOMS TO REPORT IMMEDIATELY:   Following lower endoscopy (colonoscopy or flexible sigmoidoscopy):  Excessive amounts of blood in the stool  Significant tenderness or worsening of abdominal pains  Swelling of the abdomen that is new, acute  Fever of 100F or higher   For urgent or emergent issues, a gastroenterologist can be reached at any hour by calling [phone].   DIET:  We do recommend a small meal at first, but then you may proceed to your regular diet.  Drink plenty of fluids but you should avoid alcoholic beverages for 24 hours.  ACTIVITY:  You should plan to take it easy for the rest of today and you should NOT DRIVE or use heavy machinery until tomorrow (because  of the sedation medicines used during the test).    FOLLOW UP: Our staff will call the number listed on your records 48-72 hours following your procedure to check on you and address any questions or concerns that you may have regarding the information given to you following your procedure. If we do not reach you, we will leave a message.  We will attempt to reach you two times.  During this call, we will ask if you have developed any symptoms of COVID 19. If you develop any symptoms (ie: fever, flu-like symptoms, shortness of breath, cough etc.) before then, please call [phone].  If you test positive for Covid 19 in the 2 weeks post procedure, please call and report this information to [place].    If any biopsies were taken you will be contacted by phone or by letter within the next 1-3 weeks.  Please call us at [phone] if you have not heard about the biopsies in 3 weeks.    SIGNATURES/CONFIDENTIALITY: You and/or your care partner have signed paperwork which will be entered into your electronic medical record.  These signatures attest to the fact that that the information above on your After Visit Summary has been reviewed and is understood.  Full responsibility of the confidentiality of this discharge information lies with you and/or your care-partner.

## 2019-10-07 NOTE — Progress Notes (Signed)
PT taken to PACU. Monitors in place. VSS. Report given to RN. 

## 2019-10-07 NOTE — Progress Notes (Signed)
Vitals-CW Temp-JB  Pt's states no medical or surgical changes since previsit or office visit. 

## 2019-10-09 ENCOUNTER — Telehealth: Payer: Self-pay

## 2019-10-09 NOTE — Telephone Encounter (Signed)
Attempted to reach patient for post-procedure f/u call. No answer. Left message that we will make another attempt to reach him again later today and for him to please not hesitate to call us if he has any questions/concerns regarding his care. 

## 2019-10-09 NOTE — Telephone Encounter (Signed)
  Follow up Call-  Call back number 10/07/2019  Post procedure Call Back phone  # [phone]  Permission to leave phone message Yes  Some recent data might be hidden     Patient questions:  Do you have a fever, pain , or abdominal swelling? No. Pain Score  0 *  Have you tolerated food without any problems? Yes.    Have you been able to return to your normal activities? Yes.    Do you have any questions about your discharge instructions: Diet   No. Medications  No. Follow up visit  No.  Do you have questions or concerns about your Care? No.  Actions: * If pain score is 4 or above: No action needed, pain <4.  1. Have you developed a fever since your procedure? no  2.   Have you had an respiratory symptoms (SOB or cough) since your procedure? no  3.   Have you tested positive for COVID 19 since your procedure? no  4.   Have you had any family members/close contacts diagnosed with the COVID 19 since your procedure?  no   If yes to any of these questions please route to Joylene John, RN and Alphonsa Gin, Therapist, sports.

## 2020-06-09 ENCOUNTER — Ambulatory Visit (HOSPITAL_COMMUNITY)
Admission: EM | Admit: 2020-06-09 | Discharge: 2020-06-09 | Disposition: A | Discharge status: Home / Self Care | Payer: Commercial Managed Care - PPO | Attending: Physician Assistant | Admitting: Physician Assistant

## 2020-06-09 ENCOUNTER — Encounter (HOSPITAL_COMMUNITY): Payer: Self-pay | Admitting: Emergency Medicine

## 2020-06-09 ENCOUNTER — Other Ambulatory Visit: Payer: Self-pay

## 2020-06-09 DIAGNOSIS — S61215A Laceration without foreign body of left ring finger without damage to nail, initial encounter: Secondary | ICD-10-CM | POA: Diagnosis not present

## 2020-06-09 DIAGNOSIS — S61012A Laceration without foreign body of left thumb without damage to nail, initial encounter: Secondary | ICD-10-CM | POA: Diagnosis not present

## 2020-06-09 DIAGNOSIS — S61213A Laceration without foreign body of left middle finger without damage to nail, initial encounter: Secondary | ICD-10-CM

## 2020-06-09 NOTE — Discharge Instructions (Addendum)
Keep the fingers bandaged These should heal well over the next 1 week  Minimize direct water on finger with dermabond treatment  You should follow up in 2 weeks with your PCP to get your tetanus

## 2020-06-09 NOTE — ED Triage Notes (Signed)
PT cut his hand with a knife earlier today. Lacerations on thumb, 3rd, and 4th fingers. Dressed with bandaids which are controlling bleeding.

## 2020-06-09 NOTE — ED Provider Notes (Signed)
[facility]    CSN: [id number] Arrival date & time: 06/09/20  1459      History   Chief Complaint Chief Complaint  Patient presents with  . Finger Injury    HPI Peter Townsend is a 39 y.o. male.   Patient reports for cuts to left thumb, third and fourth fingers.  He reports he use a knife earlier when he sliced his fingers.  He reports the slice on the third finger is the worst.  He reports it bled a lot.  He does report he is able to control the bleeding with Band-Aids.  Unsure of his last tetanus.  Patient reports he did just receive his Covid vaccines.     Past Medical History:  Diagnosis Date  . H pylori ulcer     Patient Active Problem List   Diagnosis Date Noted  . Fatigue 03/01/2018  . Seasonal allergic rhinitis due to pollen 03/01/2018  . Bradycardia 10/28/2017  . Carcinoid tumor of rectum 10/26/2017  . Nodule of lower lobe of left lung 10/26/2017  . H. pylori infection 01/16/2017  . Gastroesophageal reflux disease without esophagitis 01/16/2017    Past Surgical History:  Procedure Laterality Date  . COLONOSCOPY  09/01/2016   Pyrtle- low grade carcinoid       Home Medications    Prior to Admission medications   Not on File    Family History Family History  Problem Relation Age of Onset  . Diabetes Mother   . Colon cancer Father 52  . Colon polyps Brother   . Colon polyps Brother   . Esophageal cancer Neg Hx   . Stomach cancer Neg Hx   . Rectal cancer Neg Hx     Social History Social History   Tobacco Use  . Smoking status: Never Smoker  . Smokeless tobacco: Never Used  Vaping Use  . Vaping Use: Never used  Substance Use Topics  . Alcohol use: No  . Drug use: No     Allergies   Patient has no known allergies.   Review of Systems Review of Systems   Physical Exam Triage Vital Signs ED Triage Vitals [06/09/20 1548]  Enc Vitals Group     BP 120/73     Pulse Rate 60     Resp 16     Temp      Temp src       SpO2 99 %     Weight      Height      Head Circumference      Peak Flow      Pain Score 6     Pain Loc      Pain Edu?      Excl. in [place]?    No data found.  Updated Vital Signs BP 120/73   Pulse 60   Resp 16   SpO2 99%   Visual Acuity Right Eye Distance:   Left Eye Distance:   Bilateral Distance:    Right Eye Near:   Left Eye Near:    Bilateral Near:     Physical Exam Vitals and nursing note reviewed.  Musculoskeletal:     Comments: Full range of motion of the hand and fingers.  Sensation intact.  Skin:    Comments: Superficial laceration hardly visible on inspection of the volar left thumb.  There is a avulsion style laceration with flap intact in the  4th  digit, along distal dorsal aspect laterally.  No nail involvement.  Well  approximated flap  Superficial wound on volar aspect of third digit.      UC Treatments / Results  Labs (all labs ordered are listed, but only abnormal results are displayed) Labs Reviewed - No data to display  EKG   Radiology No results found.  Procedures Laceration Repair  Date/Time: 06/09/2020 5:27 PM Performed by: Purnell Shoemaker, PA-C Authorized by: Purnell Shoemaker, PA-C   Consent:    Consent obtained:  Verbal   Consent given by:  Patient   Risks discussed:  Pain, poor wound healing and need for additional repair   Alternatives discussed:  Observation Anesthesia (see MAR for exact dosages):    Anesthesia method:  None Laceration details:    Location:  Finger   Finger location:  L ring finger   Length (cm):  0.5   Depth (mm):  2 Repair type:    Repair type:  Simple Exploration:    Wound exploration: wound explored through full range of motion and entire depth of wound probed and visualized   Treatment:    Area cleansed with:  Soap and water   Amount of cleaning:  Standard   Irrigation method:  Tap Skin repair:    Repair method:  Tissue adhesive Approximation:    Approximation:  Close Post-procedure details:     Dressing:  Non-adherent dressing   (including critical care time)  Medications Ordered in UC Medications - No data to display  Initial Impression / Assessment and Plan / UC Course  I have reviewed the triage vital signs and the nursing notes.  Pertinent labs & imaging results that were available during my care of the patient were reviewed by me and considered in my medical decision making (see chart for details).     #Lacerations of fingers Patient is a 39 year old presenting with laceration to the fingers.  Only laceration requiring repair today with only third digit.  Felt very amenable to Dermabond.  Wounds were cleaned.  Discussed signs of infection.  Discussed with patient given recent code vaccination, would be best to wait 2 weeks for tetanus update, discussed that he should follow-up with his primary care about this.  Feel low risk for tetanus given clean knife wounds.  Patient verbalized understanding plan of care. Final Clinical Impressions(s) / UC Diagnoses   Final diagnoses:  Laceration of left middle finger without foreign body without damage to nail, initial encounter  Laceration of left thumb without foreign body without damage to nail, initial encounter  Laceration of left ring finger without foreign body without damage to nail, initial encounter     Discharge Instructions     Keep the fingers bandaged These should heal well over the next 1 week  Minimize direct water on finger with dermabond treatment  You should follow up in 2 weeks with your PCP to get your tetanus    ED Prescriptions    None     PDMP not reviewed this encounter.   Purnell Shoemaker, PA-C 06/10/20 0015

## 2020-06-09 NOTE — ED Notes (Signed)
Called ortho tech and they said they will be here in 5 mins

## 2020-08-03 ENCOUNTER — Encounter: Payer: Self-pay | Admitting: Internal Medicine

## 2020-08-05 ENCOUNTER — Encounter: Payer: Self-pay | Admitting: Internal Medicine

## 2020-08-05 ENCOUNTER — Other Ambulatory Visit: Payer: Self-pay

## 2020-08-05 ENCOUNTER — Ambulatory Visit (INDEPENDENT_AMBULATORY_CARE_PROVIDER_SITE_OTHER): Payer: Commercial Managed Care - PPO | Admitting: Internal Medicine

## 2020-08-05 VITALS — BP 112/72 | HR 49 | Temp 98.2°F | Resp 16 | Ht 71.0 in | Wt 199.0 lb

## 2020-08-05 DIAGNOSIS — Z1159 Encounter for screening for other viral diseases: Secondary | ICD-10-CM | POA: Insufficient documentation

## 2020-08-05 DIAGNOSIS — R001 Bradycardia, unspecified: Secondary | ICD-10-CM | POA: Diagnosis not present

## 2020-08-05 DIAGNOSIS — Z0000 Encounter for general adult medical examination without abnormal findings: Secondary | ICD-10-CM | POA: Insufficient documentation

## 2020-08-05 NOTE — Patient Instructions (Signed)
Bradycardia, Adult Bradycardia is a slower-than-normal heartbeat. A normal resting heart rate for an adult ranges from 60 to 100 beats per minute. With bradycardia, the resting heart rate is less than 60 beats per minute. Bradycardia can prevent enough oxygen from reaching certain areas of your body when you are active. It can be serious if it keeps enough oxygen from reaching your brain and other parts of your body. Bradycardia is not a problem for everyone. For some healthy adults, a slow resting heart rate is normal. What are the causes? This condition may be caused by:  A problem with the heart, including: ? A problem with the heart's electrical system, such as a heart block. With a heart block, electrical signals between the chambers of the heart are partially or completely blocked, so they are not able to work as they should. ? A problem with the heart's natural pacemaker (sinus node). ? Heart disease. ? A heart attack. ? Heart damage. ? Lyme disease. ? A heart infection. ? A heart condition that is present at birth (congenital heart defect).  Certain medicines that treat heart conditions.  Certain conditions, such as hypothyroidism and obstructive sleep apnea.  Problems with the balance of chemicals and other substances, like potassium, in the blood.  Trauma.  Radiation therapy. What increases the risk? You are more likely to develop this condition if you:  Are age 65 or older.  Have high blood pressure (hypertension), high cholesterol (hyperlipidemia), or diabetes.  Drink heavily, use tobacco or nicotine products, or use drugs. What are the signs or symptoms? Symptoms of this condition include:  Light-headedness.  Feeling faint or fainting.  Fatigue and weakness.  Trouble with activity or exercise.  Shortness of breath.  Chest pain (angina).  Drowsiness.  Confusion.  Dizziness. How is this diagnosed? This condition may be diagnosed based on:  Your  symptoms.  Your medical history.  A physical exam. During the exam, your health care provider will listen to your heartbeat and check your pulse. To confirm the diagnosis, your health care provider may order tests, such as:  Blood tests.  An electrocardiogram (ECG). This test records the heart's electrical activity. The test can show how fast your heart is beating and whether the heartbeat is steady.  A test in which you wear a portable device (event recorder or Holter monitor) to record your heart's electrical activity while you go about your day.  Anexercise test. How is this treated? Treatment for this condition depends on the cause of the condition and how severe your symptoms are. Treatment may involve:  Treatment of the underlying condition.  Changing your medicines or how much medicine you take.  Having a small, battery-operated device called a pacemaker implanted under the skin. When bradycardia occurs, this device can be used to increase your heart rate and help your heart beat in a regular rhythm. Follow these instructions at home: Lifestyle   Manage any health conditions that contribute to bradycardia as told by your health care provider.  Follow a heart-healthy diet. A nutrition specialist (dietitian) can help educate you about healthy food options and changes.  Follow an exercise program that is approved by your health care provider.  Maintain a healthy weight.  Try to reduce or manage your stress, such as with yoga or meditation. If you need help reducing stress, ask your health care provider.  Do not use any products that contain nicotine or tobacco, such as cigarettes, e-cigarettes, and chewing tobacco. If you need help   quitting, ask your health care provider.  Do not use illegal drugs.  Limit alcohol intake to no more than 1 drink a day for nonpregnant women and 2 drinks a day for men. Be aware of how much alcohol is in your drink. In the [place] one drink  equals one 12 oz bottle of beer (355 mL), one 5 oz glass of wine (148 mL), or one 1 oz glass of hard liquor (44 mL). General instructions  Take over-the-counter and prescription medicines only as told by your health care provider.  Keep all follow-up visits as told by your health care provider. This is important. How is this prevented? In some cases, bradycardia may be prevented by:  Treating underlying medical problems.  Stopping behaviors or medicines that can trigger the condition. Contact a health care provider if you:  Feel light-headed or dizzy.  Almost faint.  Feel weak or are easily fatigued during physical activity.  Experience confusion or have memory problems. Get help right away if:  You faint.  You have: ? An irregular heartbeat (palpitations). ? Chest pain. ? Trouble breathing. Summary  Bradycardia is a slower-than-normal heartbeat. With bradycardia, the resting heart rate is less than 60 beats per minute.  Treatment for this condition depends on the cause.  Manage any health conditions that contribute to bradycardia as told by your health care provider.  Do not use any products that contain nicotine or tobacco, such as cigarettes, e-cigarettes, and chewing tobacco, and limit alcohol intake.  Keep all follow-up visits as told by your health care provider. This is important. This information is not intended to replace advice given to you by your health care provider. Make sure you discuss any questions you have with your health care provider. Document Revised: 05/21/2018 Document Reviewed: 04/18/2018 Elsevier Patient Education  2020 [employer]  

## 2020-08-05 NOTE — Progress Notes (Signed)
Subjective:  Patient ID: Peter Townsend, male    DOB: 05-14-1981  Age: 39 y.o. MRN: [id number]  CC: Bradycardia  This visit occurred during the SARS-CoV-2 public health emergency.  Safety protocols were in place, including screening questions prior to the visit, additional usage of staff PPE, and extensive cleaning of exam room while observing appropriate contact time as indicated for disinfecting solutions.    HPI Peter Townsend presents for f/up - He has a longstanding history of bradycardia and it sounds like he is asymptomatic with this.  He became concerned recently when his Apple watch woke him up during the night with a heart rate down into the upper 30s.  He is active during the day and denies any recent episodes of palpitations, dizziness, lightheadedness, weakness, near syncope, or syncope.  No outpatient medications prior to visit.   Facility-Administered Medications Prior to Visit  Medication Dose Route Frequency Provider Last Rate Last Admin  . 0.9 %  sodium chloride infusion  500 mL Intravenous Once Pyrtle, Lajuan Lines, MD        ROS Review of Systems  Constitutional: Negative for diaphoresis, fatigue and unexpected weight change.  HENT: Negative.   Eyes: Negative.   Respiratory: Negative for cough, chest tightness, shortness of breath and wheezing.   Cardiovascular: Negative for chest pain, palpitations and leg swelling.  Gastrointestinal: Negative for abdominal pain, constipation, diarrhea, nausea and vomiting.  Endocrine: Negative for cold intolerance and heat intolerance.  Genitourinary: Negative.  Negative for difficulty urinating.  Musculoskeletal: Negative.  Negative for arthralgias and myalgias.  Skin: Negative.   Neurological: Negative.  Negative for dizziness, syncope, weakness and light-headedness.  Hematological: Negative for adenopathy. Does not bruise/bleed easily.  Psychiatric/Behavioral: Negative.     Objective:  BP 112/72   Pulse (!) 49   Temp 98.2 F  (36.8 C) (Oral)   Resp 16   Ht 5\' 11"  (1.803 m)   Wt 199 lb (90.3 kg)   SpO2 97%   BMI 27.75 kg/m   BP Readings from Last 3 Encounters:  08/05/20 112/72  06/09/20 120/73  10/07/19 108/64    Wt Readings from Last 3 Encounters:  08/05/20 199 lb (90.3 kg)  10/07/19 190 lb (86.2 kg)  09/23/19 190 lb (86.2 kg)    Physical Exam Vitals reviewed.  Constitutional:      Appearance: Normal appearance.  HENT:     Nose: Nose normal.     Mouth/Throat:     Mouth: Mucous membranes are moist.  Eyes:     General: No scleral icterus.    Conjunctiva/sclera: Conjunctivae normal.  Cardiovascular:     Rate and Rhythm: Regular rhythm. Bradycardia present.     Pulses: Normal pulses.     Heart sounds: No murmur heard.      Comments: EKG-  Sinus bradycardia, 43 bpm Otherwise normal EKG Pulmonary:     Effort: Pulmonary effort is normal.     Breath sounds: No stridor. No wheezing, rhonchi or rales.  Abdominal:     General: Abdomen is flat.     Palpations: There is no mass.     Tenderness: There is no abdominal tenderness. There is no guarding.  Musculoskeletal:     Right lower leg: No edema.     Left lower leg: No edema.  Skin:    General: Skin is warm and dry.  Neurological:     General: No focal deficit present.     Mental Status: He is alert.     Lab Results  Component Value Date   WBC 3.9 (L) 03/01/2018   HGB 15.1 03/01/2018   HCT 42.5 03/01/2018   PLT 271.0 03/01/2018   GLUCOSE 94 03/01/2018   ALT 22 03/01/2018   AST 16 03/01/2018   NA 138 03/01/2018   K 4.5 03/01/2018   CL 104 03/01/2018   CREATININE 0.77 03/01/2018   BUN 15 03/01/2018   CO2 29 03/01/2018   TSH 2.85 03/01/2018    No results found.  Assessment & Plan:   Peter Townsend was seen today for bradycardia.  Diagnoses and all orders for this visit:  Bradycardia- He has mild, chronic bradycardia without any symptoms related to this.  I recommended that he undergo 48-hour Holter monitor to see if there are  any pauses that need to be addressed. -     EKG 12-Lead -     CBC with Differential/Platelet; Future -     BASIC METABOLIC PANEL WITH GFR; Future -     TSH; Future -     Holter monitor - 48 hour; Future  Routine general medical examination at a health care facility- Exam completed, labs reviewed, vaccines reviewed - He refused the flu vaccine, patient education material was given. -     Lipid panel; Future -     Hepatitis C antibody; Future -     HIV Antibody (routine testing w rflx); Future  Need for hepatitis C screening test -     Hepatitis C antibody; Future   We will continue to administer sodium chloride.  No orders of the defined types were placed in this encounter.    Follow-up: Return in about 3 months (around 11/04/2020).  Scarlette Calico, MD

## 2020-08-06 LAB — CBC WITH DIFFERENTIAL/PLATELET
Absolute Monocytes: 370 cells/uL (ref 200–950)
Basophils Absolute: 59 cells/uL (ref 0–200)
Basophils Relative: 1.4 %
Eosinophils Absolute: 130 cells/uL (ref 15–500)
Eosinophils Relative: 3.1 %
HCT: 41.8 % (ref 38.5–50.0)
Hemoglobin: 14.3 g/dL (ref 13.2–17.1)
Lymphs Abs: 1793 cells/uL (ref 850–3900)
MCH: 29.4 pg (ref 27.0–33.0)
MCHC: 34.2 g/dL (ref 32.0–36.0)
MCV: 86 fL (ref 80.0–100.0)
MPV: 9.7 fL (ref 7.5–12.5)
Monocytes Relative: 8.8 %
Neutro Abs: 1848 cells/uL (ref 1500–7800)
Neutrophils Relative %: 44 %
Platelets: 256 10*3/uL (ref 140–400)
RBC: 4.86 10*6/uL (ref 4.20–5.80)
RDW: 12.2 % (ref 11.0–15.0)
Total Lymphocyte: 42.7 %
WBC: 4.2 10*3/uL (ref 3.8–10.8)

## 2020-08-06 LAB — LIPID PANEL
Cholesterol: 162 mg/dL (ref <200)
HDL: 41 mg/dL (ref >=40)
LDL Cholesterol (Calc): 99 mg/dL (calc)
Non-HDL Cholesterol (Calc): 121 mg/dL (calc) (ref <130)
Total CHOL/HDL Ratio: 4 (calc) (ref <5.0)
Triglycerides: 123 mg/dL (ref <150)

## 2020-08-06 LAB — HIV ANTIBODY (ROUTINE TESTING W REFLEX): HIV 1&2 Ab, 4th Generation: NONREACTIVE

## 2020-08-06 LAB — BASIC METABOLIC PANEL WITH GFR
BUN: 15 mg/dL (ref 7–25)
CO2: 27 mmol/L (ref 20–32)
Calcium: 9.4 mg/dL (ref 8.6–10.3)
Chloride: 103 mmol/L (ref 98–110)
Creat: 0.88 mg/dL (ref 0.60–1.35)
GFR, Est African American: 126 mL/min/{1.73_m2} (ref >=60)
GFR, Est Non African American: 109 mL/min/{1.73_m2} (ref >=60)
Glucose, Bld: 93 mg/dL (ref 65–99)
Potassium: 4.5 mmol/L (ref 3.5–5.3)
Sodium: 138 mmol/L (ref 135–146)

## 2020-08-06 LAB — HEPATITIS C ANTIBODY
Hepatitis C Ab: NONREACTIVE
SIGNAL TO CUT-OFF: 0.01 (ref <1.00)

## 2020-08-06 LAB — TSH: TSH: 3.95 mIU/L (ref 0.40–4.50)

## 2020-08-08 ENCOUNTER — Other Ambulatory Visit (INDEPENDENT_AMBULATORY_CARE_PROVIDER_SITE_OTHER): Payer: Commercial Managed Care - PPO

## 2020-08-08 DIAGNOSIS — R001 Bradycardia, unspecified: Secondary | ICD-10-CM
